# Patient Record
Sex: Female | Born: 1940
Health system: Southern US, Community
[De-identification: ages and names within clinical notes are randomized; demographics above are authoritative.]

## PROBLEM LIST (undated history)

## (undated) DIAGNOSIS — E039 Hypothyroidism, unspecified: Secondary | ICD-10-CM

## (undated) DIAGNOSIS — R569 Unspecified convulsions: Secondary | ICD-10-CM

## (undated) DIAGNOSIS — E785 Hyperlipidemia, unspecified: Secondary | ICD-10-CM

## (undated) DIAGNOSIS — E119 Type 2 diabetes mellitus without complications: Secondary | ICD-10-CM

## (undated) DIAGNOSIS — I10 Essential (primary) hypertension: Secondary | ICD-10-CM

## (undated) HISTORY — PX: BREAST SURGERY: SHX581

## (undated) HISTORY — DX: Essential (primary) hypertension: I10

## (undated) HISTORY — DX: Type 2 diabetes mellitus without complications: E11.9

## (undated) HISTORY — DX: Unspecified convulsions: R56.9

## (undated) HISTORY — DX: Hyperlipidemia, unspecified: E78.5

## (undated) HISTORY — DX: Hypothyroidism, unspecified: E03.9

## (undated) HISTORY — PX: ABDOMINAL HYSTERECTOMY: SHX81

---

## 1986-04-29 HISTORY — PX: THYROIDECTOMY: SHX17

## 2017-08-21 ENCOUNTER — Ambulatory Visit (INDEPENDENT_AMBULATORY_CARE_PROVIDER_SITE_OTHER): Payer: Medicare HMO | Admitting: Family Medicine

## 2017-08-21 ENCOUNTER — Encounter: Payer: Self-pay | Admitting: Family Medicine

## 2017-08-21 VITALS — BP 148/76 | HR 77 | Temp 97.6°F | Ht 66.0 in | Wt 288.0 lb

## 2017-08-21 DIAGNOSIS — Z7689 Persons encountering health services in other specified circumstances: Secondary | ICD-10-CM | POA: Diagnosis not present

## 2017-08-21 DIAGNOSIS — E890 Postprocedural hypothyroidism: Secondary | ICD-10-CM

## 2017-08-21 DIAGNOSIS — I10 Essential (primary) hypertension: Secondary | ICD-10-CM | POA: Diagnosis not present

## 2017-08-21 DIAGNOSIS — R569 Unspecified convulsions: Secondary | ICD-10-CM

## 2017-08-21 DIAGNOSIS — E119 Type 2 diabetes mellitus without complications: Secondary | ICD-10-CM

## 2017-08-21 LAB — GLUCOSE, POCT (MANUAL RESULT ENTRY): POC Glucose: 101 mg/dl — AB (ref 70–99)

## 2017-08-21 LAB — POCT GLYCOSYLATED HEMOGLOBIN (HGB A1C): Hemoglobin A1C: 6

## 2017-08-21 MED ORDER — AMLODIPINE BESYLATE 10 MG PO TABS: 10.0000 mg | ORAL_TABLET | Freq: Every day | ORAL | 2 refills | Status: DC

## 2017-08-21 MED ORDER — METOPROLOL SUCCINATE ER 100 MG PO TB24: 100.0000 mg | ORAL_TABLET | Freq: Every day | ORAL | 2 refills | Status: DC

## 2017-08-21 NOTE — Progress Notes (Signed)
Patient presents to clinic today to establish care.  SUBJECTIVE: PMH: Pt is a 77 yo female with pmh sig for HTN, DM, hypothyroidism, seizures.  [place] was previously seen in [place].   DM type 2: -diagnosed years ago. -taking metformin 500 mg BID -pt does not check fsbs at -Last eye exam 2018, last foot exam January 2018  History of seizures: -Last seizure was the day before Thanksgiving 2018, she was walking into a neighborhood store, felt funny, then woke up in the hospital with a gash on her forehead. -Pt states prior to that her last seizure was 4 years ago. -Pt states she was on medication for seizures but was doing well so her doctor stopped the medication and labs when she had the seizure Thanksgiving. -Pt is currently taking Keppra 500 mg twice daily. -Patient is not driving  Hypothyroidism: -Patient status post thyroidectomy for cancer in 1988 -She is currently taking levothyroxine 150 mcg daily -Pt states she had recent labs prior to leaving [place]. -Patient denies constipation, diarrhea, palpitations, heat intolerance, cold intolerance  HTN: -Patient taking metoprolol 100 mg twice daily, losartan-HCTZ 100 mg - 25 mg daily, Norvasc 10 mg daily, furosemide 40 mg as needed -Patient does not check BP at home -Patient is not currently exercising.  Allergies: NKDA  Past history Thyroidectomy 1988 for thyroid cancer Breast biopsy 2017 Hysterectomy 1975 for fibroids  Social history: Patient is married.  Patient and her husband moved from [place] to [place] to live with her daughter.  Patient states she had to move and she was unable to keep up with housework.  Patient's husband is legally blind so she was still cooking for him daily.  Patient is retired she is a former Engineer, sitemedical assistant.  Patient has 2 children.  Patient denies alcohol, tobacco, drug use.  Health Maintenance: [facility] [place] [facility] [place] Vision --Dr. Ulice BoldSilbert [place]  2018 Last physical --2019 Immunizations --Pneumovax 2018, influenza vaccine 2018.  Patient has not had shingles vaccine. Mammogram --2019 PAP --2017 Bone Density --? LMP--1975   No past medical history on file.   No current outpatient medications on file prior to visit.   No current facility-administered medications on file prior to visit.     Allergies not on file  No family history on file.  Social History   Socioeconomic History  . Marital status: Married    Spouse name: Not on file  . Number of children: Not on file  . Years of education: Not on file  . Highest education level: Not on file  Occupational History  . Not on file  Social Needs  . Financial resource strain: Not on file  . Food insecurity:    Worry: Not on file    Inability: Not on file  . Transportation needs:    Medical: Not on file    Non-medical: Not on file  Tobacco Use  . Smoking status: Not on file  Substance and Sexual Activity  . Alcohol use: Not on file  . Drug use: Not on file  . Sexual activity: Not on file  Lifestyle  . Physical activity:    Days per week: Not on file    Minutes per session: Not on file  . Stress: Not on file  Relationships  . Social connections:    Talks on phone: Not on file    Gets together: Not on file    Attends religious service: Not on file    Active member  of club or organization: Not on file    Attends meetings of clubs or organizations: Not on file    Relationship status: Not on file  . Intimate partner violence:    Fear of current or ex partner: Not on file    Emotionally abused: Not on file    Physically abused: Not on file    Forced sexual activity: Not on file  Other Topics Concern  . Not on file  Social History Narrative  . Not on file    ROS General: Denies fever, chills, night sweats, changes in weight, changes in appetite HEENT: Denies headaches, ear pain, changes in vision, rhinorrhea, sore throat CV: Denies CP, palpitations, SOB,  orthopnea Pulm: Denies SOB, cough, wheezing GI: Denies abdominal pain, nausea, vomiting, diarrhea, constipation GU: Denies dysuria, hematuria, frequency, vaginal discharge Msk: Denies muscle cramps, joint pains Neuro: Denies weakness, numbness, tingling  +h/o seizures Skin: Denies rashes, bruising Psych: Denies depression, anxiety, hallucinations  BP (!) 148/76 (BP Location: Left Arm, Patient Position: Sitting, Cuff Size: Normal)   Pulse 77   Temp 97.6 F (36.4 C) (Oral)   Ht 5\' 6"  (1.676 m)   Wt 288 lb (130.6 kg)   SpO2 97%   BMI 46.48 kg/m   Physical Exam Gen. Pleasant, well developed, well-nourished, in NAD HEENT - [place]/AT, PERRL, no scleral icterus, no nasal drainage, pharynx without erythema or exudate.  TMs normal bilaterally.  The neck. Lungs: no use of accessory muscles, CTAB, no wheezes, rales or rhonchi Cardiovascular: RRR, No r/g/m, no peripheral edema Abdomen: BS present, soft, nontender, nondistended Neuro:  A&Ox3, CN II-XII intact, normal gait Skin:  Warm, dry, intact, no lesions Psych: normal affect, mood appropriate  No results found for this or any previous visit (from the past 2160 hour(s)).  Assessment/Plan: Type 2 diabetes mellitus without complication, without long-term current use of insulin (HCC)  -Continue metformin 500 mg twice daily -Hemoglobin A1c 6% -FSBS 101 -Continue lifestyle modifications - Plan: Ambulatory referral to Podiatry, POCT glucose (manual entry), POCT glycosylated hemoglobin (Hb A1C)  Essential hypertension -Controlled -Patient encouraged to limit sodium intake  - Plan: amLODipine (NORVASC) 10 MG tablet, metoprolol succinate (TOPROL-XL) 100 MG 24 hr tablet  Postoperative hypothyroidism -Continue levothyroxine 150 mcg daily -We will obtain previous records. -We will obtain TSH in the future.  Seizures (HCC) -Continue Keppra 500 mg twice daily - Plan: Ambulatory referral to Neurology  Encounter to establish care -We reviewed  the PMH, PSH, FH, SH, Meds and Allergies. -We provided refills for any medications we will prescribe as needed. -We addressed current concerns per orders and patient instructions. -We have asked for records for pertinent exams, studies, vaccines and notes from previous providers. -We have advised patient to follow up per instructions below.  F/u in the next 2-3 months, sooner if needed.  Abbe Amsterdam, MD

## 2017-08-21 NOTE — Patient Instructions (Addendum)
You should get a blood pressure monitor to measure your blood pressure at home.  They can be found at your local drugstore, [employer], [employer], on-line at places such as [employer].siAmazon.com.  Try to find a blood pressure cuff that goes on your upper as opposed to your wrist.  Check your blood pressure daily and keep a log of this to bring with you to appointments.  If your blood pressure is well controlled, we may be able to decrease some of your medications.   DASH Eating Plan DASH stands for "Dietary Approaches to Stop Hypertension." The DASH eating plan is a healthy eating plan that has been shown to reduce high blood pressure (hypertension). It may also reduce your risk for type 2 diabetes, heart disease, and stroke. The DASH eating plan may also help with weight loss. What are tips for following this plan? General guidelines  Avoid eating more than 2,300 mg (milligrams) of salt (sodium) a day. If you have hypertension, you may need to reduce your sodium intake to 1,500 mg a day.  Limit alcohol intake to no more than 1 drink a day for nonpregnant women and 2 drinks a day for men. One drink equals 12 oz of beer, 5 oz of wine, or 1 oz of hard liquor.  Work with your health care provider to maintain a healthy body weight or to lose weight. Ask what an ideal weight is for you.  Get at least 30 minutes of exercise that causes your heart to beat faster (aerobic exercise) most days of the week. Activities may include walking, swimming, or biking.  Work with your health care provider or diet and nutrition specialist (dietitian) to adjust your eating plan to your individual calorie needs. Reading food labels  Check food labels for the amount of sodium per serving. Choose foods with less than 5 percent of the Daily Value of sodium. Generally, foods with less than 300 mg of sodium per serving fit into this eating plan.  To find whole grains, look for the word "whole" as the first word in the ingredient  list. Shopping  Buy products labeled as "low-sodium" or "no salt added."  Buy fresh foods. Avoid canned foods and premade or frozen meals. Cooking  Avoid adding salt when cooking. Use salt-free seasonings or herbs instead of table salt or sea salt. Check with your health care provider or pharmacist before using salt substitutes.  Do not fry foods. Cook foods using healthy methods such as baking, boiling, grilling, and broiling instead.  Cook with heart-healthy oils, such as olive, canola, soybean, or sunflower oil. Meal planning   Eat a balanced diet that includes: ? 5 or more servings of fruits and vegetables each day. At each meal, try to fill half of your plate with fruits and vegetables. ? Up to 6-8 servings of whole grains each day. ? Less than 6 oz of lean meat, poultry, or fish each day. A 3-oz serving of meat is about the same size as a deck of cards. One egg equals 1 oz. ? 2 servings of low-fat dairy each day. ? A serving of nuts, seeds, or beans 5 times each week. ? Heart-healthy fats. Healthy fats called Omega-3 fatty acids are found in foods such as flaxseeds and coldwater fish, like sardines, salmon, and mackerel.  Limit how much you eat of the following: ? Canned or prepackaged foods. ? Food that is high in trans fat, such as fried foods. ? Food that is high in saturated fat, such  as fatty meat. ? Sweets, desserts, sugary drinks, and other foods with added sugar. ? Full-fat dairy products.  Do not salt foods before eating.  Try to eat at least 2 vegetarian meals each week.  Eat more home-cooked food and less restaurant, buffet, and fast food.  When eating at a restaurant, ask that your food be prepared with less salt or no salt, if possible. What foods are recommended? The items listed may not be a complete list. Talk with your dietitian about what dietary choices are best for you. Grains Whole-grain or whole-wheat bread. Whole-grain or whole-wheat pasta. Brown  rice. [place]. Bulgur. Whole-grain and low-sodium cereals. Pita bread. Low-fat, low-sodium crackers. Whole-wheat flour tortillas. Vegetables Fresh or frozen vegetables (raw, steamed, roasted, or grilled). Low-sodium or reduced-sodium tomato and vegetable juice. Low-sodium or reduced-sodium tomato sauce and tomato paste. Low-sodium or reduced-sodium canned vegetables. Fruits All fresh, dried, or frozen fruit. Canned fruit in natural juice (without added sugar). Meat and other protein foods Skinless chicken or [place]. Ground chicken or [place]. Pork with fat trimmed off. Fish and seafood. Egg whites. Dried beans, peas, or lentils. Unsalted nuts, nut butters, and seeds. Unsalted canned beans. Lean cuts of beef with fat trimmed off. Low-sodium, lean deli meat. Dairy Low-fat (1%) or fat-free (skim) milk. Fat-free, low-fat, or reduced-fat cheeses. Nonfat, low-sodium ricotta or cottage cheese. Low-fat or nonfat yogurt. Low-fat, low-sodium cheese. Fats and oils Soft margarine without trans fats. Vegetable oil. Low-fat, reduced-fat, or light mayonnaise and salad dressings (reduced-sodium). Canola, safflower, olive, soybean, and sunflower oils. Avocado. Seasoning and other foods Herbs. Spices. Seasoning mixes without salt. Unsalted popcorn and pretzels. Fat-free sweets. What foods are not recommended? The items listed may not be a complete list. Talk with your dietitian about what dietary choices are best for you. Grains Baked goods made with fat, such as croissants, muffins, or some breads. Dry pasta or rice meal packs. Vegetables Creamed or fried vegetables. Vegetables in a cheese sauce. Regular canned vegetables (not low-sodium or reduced-sodium). Regular canned tomato sauce and paste (not low-sodium or reduced-sodium). Regular tomato and vegetable juice (not low-sodium or reduced-sodium). Angie Fava. Olives. Fruits Canned fruit in a light or heavy syrup. Fried fruit. Fruit in cream or butter  sauce. Meat and other protein foods Fatty cuts of meat. Ribs. Fried meat. Berniece Salines. Sausage. Bologna and other processed lunch meats. Salami. Fatback. Hotdogs. Bratwurst. Salted nuts and seeds. Canned beans with added salt. Canned or smoked fish. Whole eggs or egg yolks. Chicken or [place] with skin. Dairy Whole or 2% milk, cream, and half-and-half. Whole or full-fat cream cheese. Whole-fat or sweetened yogurt. Full-fat cheese. Nondairy creamers. Whipped toppings. Processed cheese and cheese spreads. Fats and oils Butter. Stick margarine. Lard. Shortening. Ghee. Bacon fat. Tropical oils, such as coconut, palm kernel, or palm oil. Seasoning and other foods Salted popcorn and pretzels. Onion salt, garlic salt, seasoned salt, table salt, and sea salt. Worcestershire sauce. Tartar sauce. Barbecue sauce. Teriyaki sauce. Soy sauce, including reduced-sodium. Steak sauce. Canned and packaged gravies. Fish sauce. Oyster sauce. Cocktail sauce. Horseradish that you find on the shelf. Ketchup. Mustard. Meat flavorings and tenderizers. Bouillon cubes. Hot sauce and Tabasco sauce. Premade or packaged marinades. Premade or packaged taco seasonings. Relishes. Regular salad dressings. Where to find more information:  National Heart, Lung, and [place]: https://wilson-eaton.com/  American Heart Association: www.heart.org Summary  The DASH eating plan is a healthy eating plan that has been shown to reduce high blood pressure (hypertension). It may also reduce your risk  for type 2 diabetes, heart disease, and stroke.  With the DASH eating plan, you should limit salt (sodium) intake to 2,300 mg a day. If you have hypertension, you may need to reduce your sodium intake to 1,500 mg a day.  When on the DASH eating plan, aim to eat more fresh fruits and vegetables, whole grains, lean proteins, low-fat dairy, and heart-healthy fats.  Work with your health care provider or diet and nutrition specialist (dietitian) to adjust  your eating plan to your individual calorie needs. This information is not intended to replace advice given to you by your health care provider. Make sure you discuss any questions you have with your health care provider. Document Released: 04/04/2011 Document Revised: 04/08/2016 Document Reviewed: 04/08/2016 Elsevier Interactive Patient Education  2018 [employer].  Diabetes Mellitus and Skin Care Diabetes (diabetes mellitus) can lead to health problems over time, including skin problems. People with diabetes have a higher risk for many types of skin complications. This is because having poorly controlled blood sugar (glucose) levels can:  Damage nerves and blood vessels. This can result in decreased feeling in your legs and feet, which means you may not notice minor skin injuries that could lead to serious problems.  Reduce blood flow (circulation), which makes wounds heal more slowly and increases your risk of infection.  Cause areas of skin to become thick or discolored.  What are some common skin conditions that affect people with diabetes? Diabetes often causes dry skin. It can also cause the skin on the feet to get thinner, break more easily, and heal more slowly. There are certain skin conditions that commonly affect people who have diabetes, such as:  Bacterial skin infections, such as styes, boils, infected hair follicles, and infections of the skin around the nails.  Fungal skin infections. These are most common in areas where skin rubs together, such as in the armpits or under the breasts.  Open sores, especially on the feet.  Tissue death (gangrene). This can happen on your feet if a serious infection does not heal properly. Gangrene can cause the need for a foot or leg to be surgically removed (amputated).  Diabetes can also cause the skin to change. You may develop:  Dark, velvety markings on the skin that usually appear on the face, neck, armpits, inner thighs, and groin  (acanthosis nigricans). This typically affects people of African-American and American-Indian descent.  Red, raised, scar-like tissue that may itch, feel painful, or develop into a wound (necrobiosis lipoidica).  Blisters on feet, toes, hands, or fingers.  Thickened, wax-like areas of skin that usually occur on the hands, forehead, or toes (digital sclerosis).  Brown or red ring-shaped or half-ring-shaped patches of skin on the ears or fingers (disseminated granuloma).  Pea-shaped yellow bumps that may be itchy and surrounded by a red ring (eruptive xanthomatosis). This usually affects the arms, feet, buttocks, and the top of the hands.  Round, discolored patches of tan skin that do not hurt or itch (diabetic dermopathy). These may look like age spots.  What do I need to know about itchy skin? It is common for people with diabetes to have itchy skin caused by dryness. Frequent high blood glucose levels can cause itchiness, and poor circulation and certain skin infections can make dry, itchy skin worse. If you have itchy skin that is red or covered in a rash, this could be a sign of an allergic reaction to a medicine. If you have a rash or if your  skin is very itchy, contact your health care provider. You may need help to manage your diabetes better, or you may need treatment for an infection. How can I prevent skin breakdown? When you have diabetes and you get a badly infected ulcer or sore that does not heal, your skin can break down, especially if you have poor circulation or are on bed rest. To prevent skin breakdown:  Keep your skin clean and dry. Wash your skin often. Do not use hot water.  Do not use any products that contain nicotine or tobacco, such as cigarettes and e-cigarettes. Smoking affects the body's ability to heal. If you need help quitting, ask your health care provider.  Check your skin every day for cuts, bruises, redness, blisters, or sores, especially on your feet. Tell  your health care provider about any cuts, wounds, or sores you have, especially if they are healing slowly.  If you are on bed rest, try to change positions often.  What else do I need to know about taking care of my skin?   To relieve dry skin and itching: ? Limit baths and showers to 5-10 minutes. ? Bathe with lukewarm water instead of hot water. ? Use mild soap and gentle skin cleansers. Do not use soap that is perfumed or harsh or dries your skin. ? Put on lotion as soon as you finish bathing.  Make sure that your health care provider performs a visual foot exam at every medical visit.  Schedule a foot exam with your health care provider once every year. This exam includes an inspection of the structure and skin of your feet.  If you get a skin injury, such as a cut, blister, or sore, check the area every day for signs of infection. Check for: ? More redness, swelling, or pain. ? More fluid or blood. ? Warmth. ? Pus or a bad smell. Contact a health care provider if:  You develop a cut or sore, especially on your feet.  You develop signs of infection after a skin injury.  Your blood glucose level is higher than 240 mg/dL (16.1 mmol/L) for 2 days in a row.  You have itchy skin that develops redness or a rash.  You have discolored areas of skin.  You have areas where your skin is changing, such as thickening or appearing shiny. This information is not intended to replace advice given to you by your health care provider. Make sure you discuss any questions you have with your health care provider. Document Released: 09/26/2015 Document Revised: 11/03/2015 Document Reviewed: 09/26/2015 Elsevier Interactive Patient Education  2018 [employer].  How to Take Your Blood Pressure You can take your blood pressure at home with a machine. You may need to check your blood pressure at home:  To check if you have high blood pressure (hypertension).  To check your blood pressure over  time.  To make sure your blood pressure medicine is working.  Supplies needed: You will need a blood pressure machine, or monitor. You can buy one at a drugstore or online. When choosing one:  Choose one with an arm cuff.  Choose one that wraps around your upper arm. Only one finger should fit between your arm and the cuff.  Do not choose one that measures your blood pressure from your wrist or finger.  Your doctor can suggest a monitor. How to prepare Avoid these things for 30 minutes before checking your blood pressure:  Drinking caffeine.  Drinking alcohol.  Eating.  Smoking.  Exercising.  Five minutes before checking your blood pressure:  Pee.  Sit in a dining chair. Avoid sitting in a soft couch or armchair.  Be quiet. Do not talk.  How to take your blood pressure Follow the instructions that came with your machine. If you have a digital blood pressure monitor, these may be the instructions: 1. Sit up straight. 2. Place your feet on the floor. Do not cross your ankles or legs. 3. Rest your left arm at the level of your heart. You may rest it on a table, desk, or chair. 4. Pull up your shirt sleeve. 5. Wrap the blood pressure cuff around the upper part of your left arm. The cuff should be 1 inch (2.5 cm) above your elbow. It is best to wrap the cuff around bare skin. 6. Fit the cuff snugly around your arm. You should be able to place only one finger between the cuff and your arm. 7. Put the cord inside the groove of your elbow. 8. Press the power button. 9. Sit quietly while the cuff fills with air and loses air. 10. Write down the numbers on the screen. 11. Wait 2-3 minutes and then repeat steps 1-10.  What do the numbers mean? Two numbers make up your blood pressure. The first number is called systolic pressure. The second is called diastolic pressure. An example of a blood pressure reading is "120 over 80" (or 120/80). If you are an adult and do not have a  medical condition, use this guide to find out if your blood pressure is normal: Normal  First number: below 120.  Second number: below 80. Elevated  First number: 120-129.  Second number: below 80. Hypertension stage 1  First number: 130-139.  Second number: 80-89. Hypertension stage 2  First number: 140 or above.  Second number: 90 or above. Your blood pressure is above normal even if only the top or bottom number is above normal. Follow these instructions at home:  Check your blood pressure as often as your doctor tells you to.  Take your monitor to your next doctor's appointment. Your doctor will: ? Make sure you are using it correctly. ? Make sure it is working right.  Make sure you understand what your blood pressure numbers should be.  Tell your doctor if your medicines are causing side effects. Contact a doctor if:  Your blood pressure keeps being high. Get help right away if:  Your first blood pressure number is higher than 180.  Your second blood pressure number is higher than 120. This information is not intended to replace advice given to you by your health care provider. Make sure you discuss any questions you have with your health care provider. Document Released: 03/28/2008 Document Revised: 03/13/2016 Document Reviewed: 09/22/2015 Elsevier Interactive Patient Education  [employer].

## 2017-08-26 ENCOUNTER — Telehealth: Payer: Self-pay | Admitting: Family Medicine

## 2017-08-26 ENCOUNTER — Other Ambulatory Visit: Payer: Self-pay | Admitting: Family Medicine

## 2017-08-26 MED ORDER — ATORVASTATIN CALCIUM 80 MG PO TABS: 80.0000 mg | ORAL_TABLET | Freq: Every day | ORAL | 1 refills | Status: DC

## 2017-08-26 NOTE — Telephone Encounter (Signed)
Copied from CRM [id number]. Topic: Quick Communication - Rx Refill/Question >> Aug 26, 2017 11:04 AM Maia Petties wrote: Medication: atrovastatin - pt has 4 pills left - she takes 1/day Has the patient contacted their pharmacy? No - first refill from Dr. Salomon Fick. Preferred Pharmacy (with phone number or street name): [id number] [place], [place] - [address] [phone] (Phone) [phone] (Fax)

## 2017-08-26 NOTE — Telephone Encounter (Signed)
Medication was refilled.

## 2017-09-16 ENCOUNTER — Telehealth: Payer: Self-pay | Admitting: Family Medicine

## 2017-09-16 NOTE — Telephone Encounter (Signed)
Copied from CRM [id number]. Topic: Quick Communication - Rx Refill/Question >> Sep 16, 2017 10:09 AM Floria Raveling A wrote: Medication:  losartan-hydrochlorothiazide (HYZAAR) 100-12.5 MG tablet [[id number] aspirin 81 MG tablet [[id number]] levothyroxine (SYNTHROID, LEVOTHROID) 150 MCG tablet [[id number]   Has the patient contacted their pharmacy? No  (Agent: If no, request that the patient contact the pharmacy for the refill.) (Agent: If yes, when and what did the pharmacy advise?)  Preferred Pharmacy (with phone number [facility] street name): [facility] [address]   Agent: Please be advised that RX refills may take up to 3 business days. We ask that you follow-up with your pharmacy.

## 2017-09-16 NOTE — Telephone Encounter (Signed)
Pt requesting refill of Losartan-hydrochlorothiazide (Hyzaar) and Levothyroxine (Synthroid) .Medications previously filled by historical provider.   LOV: 08/21/17 Dr. Salomon Fick  [facility] [address]

## 2017-09-17 NOTE — Telephone Encounter (Signed)
Ok to refill both meds 

## 2017-09-17 NOTE — Telephone Encounter (Signed)
Please advise 

## 2017-09-18 MED ORDER — LOSARTAN POTASSIUM-HCTZ 100-12.5 MG PO TABS: 1.0000 | ORAL_TABLET | Freq: Every day | ORAL | 1 refills | Status: DC

## 2017-09-18 MED ORDER — LEVOTHYROXINE SODIUM 150 MCG PO TABS: 150.0000 ug | ORAL_TABLET | Freq: Every day | ORAL | 1 refills | Status: DC

## 2017-09-18 NOTE — Telephone Encounter (Signed)
Medication filled to pharmacy as requested.   

## 2017-10-24 ENCOUNTER — Ambulatory Visit: Payer: Medicare HMO | Admitting: Family Medicine

## 2017-10-24 ENCOUNTER — Ambulatory Visit: Payer: Medicare HMO | Admitting: Podiatry

## 2017-11-06 ENCOUNTER — Other Ambulatory Visit: Payer: Self-pay | Admitting: Family Medicine

## 2017-11-06 NOTE — Telephone Encounter (Signed)
Copied from CRM [id number]. Topic: Quick Communication - See Telephone Encounter >> Nov 06, 2017 12:03 PM Windy KalataMichael, Ingrid Shifrin L, NT wrote: CRM for notification. See Telephone encounter for: 11/06/17.  Patient is needing a refill on metFORMIN (GLUCOPHAGE) 500 MG tablet and levETIRAcetam (KEPPRA) 500 MG table. Please advise.  [id number] [place] [place] - [address] [place] [id number] Phone: [phone] Fax: [phone]

## 2017-11-07 NOTE — Telephone Encounter (Signed)
Ok to refill both metformin and keppra.

## 2017-11-10 MED ORDER — LEVETIRACETAM 500 MG PO TABS: 500.0000 mg | ORAL_TABLET | Freq: Two times a day (BID) | ORAL | 2 refills | Status: DC

## 2017-11-10 MED ORDER — METFORMIN HCL 500 MG PO TABS: 500.0000 mg | ORAL_TABLET | Freq: Two times a day (BID) | ORAL | 2 refills | Status: DC

## 2017-11-10 NOTE — Telephone Encounter (Signed)
Patient;s daughter called and said only has 1 tablet left for her seizure medication and really needs this today. Please advise. [facility] [address]

## 2017-11-10 NOTE — Telephone Encounter (Signed)
Pt aware that Rxs have been filled to pharmacy. Nothing further needed.

## 2017-11-12 DIAGNOSIS — I10 Essential (primary) hypertension: Secondary | ICD-10-CM | POA: Diagnosis not present

## 2017-11-12 DIAGNOSIS — Z6841 Body Mass Index (BMI) 40.0 and over, adult: Secondary | ICD-10-CM | POA: Diagnosis not present

## 2017-11-12 DIAGNOSIS — E1140 Type 2 diabetes mellitus with diabetic neuropathy, unspecified: Secondary | ICD-10-CM | POA: Diagnosis not present

## 2017-11-12 DIAGNOSIS — G40909 Epilepsy, unspecified, not intractable, without status epilepticus: Secondary | ICD-10-CM | POA: Diagnosis not present

## 2017-11-12 DIAGNOSIS — E782 Mixed hyperlipidemia: Secondary | ICD-10-CM | POA: Diagnosis not present

## 2017-11-12 DIAGNOSIS — R600 Localized edema: Secondary | ICD-10-CM | POA: Diagnosis not present

## 2017-11-12 DIAGNOSIS — R011 Cardiac murmur, unspecified: Secondary | ICD-10-CM | POA: Diagnosis not present

## 2017-11-12 DIAGNOSIS — E890 Postprocedural hypothyroidism: Secondary | ICD-10-CM | POA: Diagnosis not present

## 2018-02-19 ENCOUNTER — Ambulatory Visit: Payer: Medicare HMO | Admitting: Podiatry

## 2018-02-19 ENCOUNTER — Encounter: Payer: Self-pay | Admitting: Podiatry

## 2018-02-19 VITALS — BP 113/66

## 2018-02-19 DIAGNOSIS — L84 Corns and callosities: Secondary | ICD-10-CM | POA: Diagnosis not present

## 2018-02-19 DIAGNOSIS — E1142 Type 2 diabetes mellitus with diabetic polyneuropathy: Secondary | ICD-10-CM

## 2018-02-19 DIAGNOSIS — B353 Tinea pedis: Secondary | ICD-10-CM | POA: Diagnosis not present

## 2018-02-19 DIAGNOSIS — M79675 Pain in left toe(s): Secondary | ICD-10-CM | POA: Diagnosis not present

## 2018-02-19 DIAGNOSIS — B351 Tinea unguium: Secondary | ICD-10-CM

## 2018-02-19 DIAGNOSIS — M79674 Pain in right toe(s): Secondary | ICD-10-CM | POA: Diagnosis not present

## 2018-02-19 MED ORDER — CLOTRIMAZOLE-BETAMETHASONE 1-0.05 % EX CREA: TOPICAL_CREAM | CUTANEOUS | 1 refills | Status: DC

## 2018-02-19 NOTE — Patient Instructions (Signed)
Athlete's Foot Athlete's foot (tinea pedis) is a fungal infection of the skin on the feet. It often occurs on the skin that is between or underneath the toes. It can also occur on the soles of the feet. The infection can spread from person to person (is contagious). What are the causes? Athlete's foot is caused by a fungus. This fungus grows in warm, moist places. Most people get athlete's foot by sharing shower stalls, towels, and wet floors with someone who is infected. Not washing your feet or changing your socks often enough can contribute to athlete's foot. What increases the risk? This condition is more likely to develop in:  Men.  People who have a weak body defense system (immune system).  People who have diabetes.  People who use public showers, such as at a gym.  People who wear heavy-duty shoes, such as industrial or military shoes.  Seasons with warm, humid weather.  What are the signs or symptoms? Symptoms of this condition include:  Itchy areas between the toes or on the soles of the feet.  White, flaky, or scaly areas between the toes or on the soles of the feet.  Very itchy small blisters between the toes or on the soles of the feet.  Small cuts on the skin. These cuts can become infected.  Thick or discolored toenails.  How is this diagnosed? This condition is diagnosed with a medical history and physical exam. Your health care provider may also take a skin or toenail sample to be examined. How is this treated? Treatment for this condition includes antifungal medicines. These may be applied as powders, ointments, or creams. In severe cases, an oral antifungal medicine may be given. Follow these instructions at home:  Apply or take over-the-counter and prescription medicines only as told by your health care provider.  Keep all follow-up visits as told by your health care provider. This is important.  Do not scratch your feet.  Keep your feet dry: ? Wear  cotton or wool socks. Change your socks every day or if they become wet. ? Wear shoes that allow air to circulate, such as sandals or canvas tennis shoes.  Wash and dry your feet: ? Every day or as told by your health care provider. ? After exercising. ? Including the area between your toes.  Do not share towels, nail clippers, or other personal items that touch your feet with others.  If you have diabetes, keep your blood sugar under control. How is this prevented?  Do not share towels.  Wear sandals in wet areas, such as locker rooms and shared showers.  Keep your feet dry: ? Wear cotton or wool socks. Change your socks every day or if they become wet. ? Wear shoes that allow air to circulate, such as sandals or canvas tennis shoes.  Wash and dry your feet after exercising. Pay attention to the area between your toes. Contact a health care provider if:  You have a fever.  You have swelling, soreness, warmth, or redness in your foot.  You are not getting better with treatment.  Your symptoms get worse.  You have new symptoms. This information is not intended to replace advice given to you by your health care provider. Make sure you discuss any questions you have with your health care provider. Document Released: 04/12/2000 Document Revised: 09/21/2015 Document Reviewed: 10/17/2014 Elsevier Interactive Patient Education  2018 Elsevier Inc[employer] Diabetes and Foot Care Diabetes may cause you to have problems because of poor   blood supply (circulation) to your feet and legs. This may cause the skin on your feet to become thinner, break easier, and heal more slowly. Your skin may become dry, and the skin may peel and crack. You may also have nerve damage in your legs and feet causing decreased feeling in them. You may not notice minor injuries to your feet that could lead to infections or more serious problems. Taking care of your feet is one of the most important things you can do for  yourself. Follow these instructions at home:  Wear shoes at all times, even in the house. Do not go barefoot. Bare feet are easily injured.  Check your feet daily for blisters, cuts, and redness. If you cannot see the bottom of your feet, use a mirror or ask someone for help.  Wash your feet with warm water (do not use hot water) and mild soap. Then pat your feet and the areas between your toes until they are completely dry. Do not soak your feet as this can dry your skin.  Apply a moisturizing lotion or petroleum jelly (that does not contain alcohol and is unscented) to the skin on your feet and to dry, brittle toenails. Do not apply lotion between your toes.  Trim your toenails straight across. Do not dig under them or around the cuticle. File the edges of your nails with an emery board or nail file.  Do not cut corns or calluses or try to remove them with medicine.  Wear clean socks or stockings every day. Make sure they are not too tight. Do not wear knee-high stockings since they may decrease blood flow to your legs.  Wear shoes that fit properly and have enough cushioning. To break in new shoes, wear them for just a few hours a day. This prevents you from injuring your feet. Always look in your shoes before you put them on to be sure there are no objects inside.  Do not cross your legs. This may decrease the blood flow to your feet.  If you find a minor scrape, cut, or break in the skin on your feet, keep it and the skin around it clean and dry. These areas may be cleansed with mild soap and water. Do not cleanse the area with peroxide, alcohol, or iodine.  When you remove an adhesive bandage, be sure not to damage the skin around it.  If you have a wound, look at it several times a day to make sure it is healing.  Do not use heating pads or hot water bottles. They may burn your skin. If you have lost feeling in your feet or legs, you may not know it is happening until it is too  late.  Make sure your health care provider performs a complete foot exam at least annually or more often if you have foot problems. Report any cuts, sores, or bruises to your health care provider immediately. Contact a health care provider if:  You have an injury that is not healing.  You have cuts or breaks in the skin.  You have an ingrown nail.  You notice redness on your legs or feet.  You feel burning or tingling in your legs or feet.  You have pain or cramps in your legs and feet.  Your legs or feet are numb.  Your feet always feel cold. Get help right away if:  There is increasing redness, swelling, or pain in or around a wound.  There is a   red line that goes up your leg.  Pus is coming from a wound.  You develop a fever or as directed by your health care provider.  You notice a bad smell coming from an ulcer or wound. This information is not intended to replace advice given to you by your health care provider. Make sure you discuss any questions you have with your health care provider. Document Released: 04/12/2000 Document Revised: 09/21/2015 Document Reviewed: 09/22/2012 Elsevier Interactive Patient Education  2017 Elsevier Inc.  

## 2018-02-26 DIAGNOSIS — Z0000 Encounter for general adult medical examination without abnormal findings: Secondary | ICD-10-CM | POA: Diagnosis not present

## 2018-02-26 DIAGNOSIS — R399 Unspecified symptoms and signs involving the genitourinary system: Secondary | ICD-10-CM | POA: Diagnosis not present

## 2018-02-26 DIAGNOSIS — Z23 Encounter for immunization: Secondary | ICD-10-CM | POA: Diagnosis not present

## 2018-03-13 ENCOUNTER — Encounter: Payer: Self-pay | Admitting: Podiatry

## 2018-03-13 ENCOUNTER — Other Ambulatory Visit: Payer: Self-pay | Admitting: Family Medicine

## 2018-03-13 NOTE — Progress Notes (Signed)
Subjective: Beth Lawson presents today referred by Dr. Abbe AmsterdamShannon Banks.  She presents with diabetes and cc of painful, discolored, thick toenails which interfere with daily activities.  Pain is aggravated when wearing enclosed shoe gear.   Beth Lawson has h/o seizures and her husband is blind. [place] She and her husband moved because of ongoing health issues and to be closer to their daughter.  Medical History   Date Unknown Diabetes mellitus, type 2 (HCC)  Date Unknown HTN (hypertension)  Date Unknown Hyperlipidemia  Date Unknown Hypothyroidism  Date Unknown Seizures [facility](HCC)   Surgical History    1988 Thyroidectomy  Date Unknown Abdominal hysterectomy  Date Unknown Breast surgery   Medications    amLODipine (NORVASC) 10 MG tablet    aspirin 81 MG tablet    atorvastatin (LIPITOR) 80 MG tablet    furosemide (LASIX) 40 MG tablet    levETIRAcetam (KEPPRA) 500 MG tablet    levothyroxine (SYNTHROID, LEVOTHROID) 150 MCG tablet    losartan-hydrochlorothiazide (HYZAAR) 100-12.5 MG tablet    metFORMIN (GLUCOPHAGE) 500 MG tablet    metoprolol succinate (TOPROL-XL) 100 MG 24 hr tablet    Multiple Vitamins-Minerals (CENTRUM SILVER ADULT 50+ PO)    Allergies      No Known Allergies   Tobacco History   Smoking Status  Never Smoker  Smokeless Tobacco Status  Never Used   Family History    None   Review of systems: Constitutional: Denies chills fatigue fever sweats weight change Eyes: Denies diplopia glare light sensitivity Ears nose mouth throat: Denies vertigo denies bloody nose rhinitis denies cold sores and snoring Cardiovascular: Denies chest pain tightness, +HTN Respiratory: Denies difficulty breathing, denies congestion Gastrointestinal: Denies abdominal pain, diarrhea, nausea, vomiting Genitourinary: Denies nocturia, pain on urination, blood in urine Musculoskeletal: Denies cramping Skin: +changes in toenails, denies color change  dryness, itchy skin, mole changes, or rash  Neurological: Denies fainting, +Seizures, denies change in speech.  Positive for headaches periodically Endocrine: Denies dry mouth, denies flushing, denies heat intolerance, denies cold intolerance, denies excessive thirst, denies polyuria, denies nocturia, +hypothyroidism Hematological: Denies easy bleeding, excessive bleeding, easy bruising, denies enlarged lymph nodes Allergy/immunological: Denies hives denies frequent infections  Objective: Vascular Examination: Capillary refill time <3 seconds x 10 digits Dorsalis pedis 2/4 b/l posterior tibial pulses 1/4 b/l No digital hair x 10 digits Skin temperature gradient is WNL  Dermatological Examination: Skin with normal turgor, texture and tone b/l Toenails 1-5 b/l discolored, thick, dystrophic with subungual debris and pain with palpation to nailbeds due to thickness of nails. Diffuse scaling noted peripherally and plantarly b/l feet with mild foot odor.  No interdigital macerations.  No blisters, no weeping. No signs of secondary bacterial infection noted. Hyperkeratotic lesions x 2  Musculoskeletal: Muscle strength 5/5 to all LE muscle groups  Neurological: Sensation diminished with 10 gram monofilament. Vibratory sensation diminished.  Assessment: 1. Painful onychomycosis toenails 1-5 b/l  2. NIDDM with periphperal neuropathy 3. Tinea pedis b/l 4. Corns x 2  Plan: 1. Discussed examination and diagnoses on today. Patient consented to treatment on today. Written literature dispensed on today. 2. Toenails 1-5 b/l were debrided in length and girth without iatrogenic bleeding. 3. For tinea pedis, Lotrisone prescribed for patient to apply to both feet twice daily for 4 weeks. 4. Hyperkeratoses debrided x 2. 5. Patient to continue soft, supportive shoe gear 6. Patient to report any pedal injuries to medical professional immediately. 7. Follow up 3 months.  Patient/POA to call should  there be a concern in the interim.

## 2018-03-16 NOTE — Telephone Encounter (Signed)
Pt needs an office visit for more refills 

## 2018-04-11 ENCOUNTER — Other Ambulatory Visit: Payer: Self-pay | Admitting: Family Medicine

## 2018-04-13 ENCOUNTER — Other Ambulatory Visit: Payer: Self-pay

## 2018-04-16 NOTE — Telephone Encounter (Signed)
Pt needs an appointment for further refills  

## 2018-05-21 ENCOUNTER — Ambulatory Visit: Payer: Medicare HMO | Admitting: Podiatry

## 2018-05-21 DIAGNOSIS — E1142 Type 2 diabetes mellitus with diabetic polyneuropathy: Secondary | ICD-10-CM

## 2018-05-21 DIAGNOSIS — L84 Corns and callosities: Secondary | ICD-10-CM

## 2018-05-21 DIAGNOSIS — B351 Tinea unguium: Secondary | ICD-10-CM

## 2018-05-21 DIAGNOSIS — M79675 Pain in left toe(s): Secondary | ICD-10-CM

## 2018-05-21 DIAGNOSIS — M79674 Pain in right toe(s): Secondary | ICD-10-CM

## 2018-05-21 NOTE — Patient Instructions (Signed)
Onychomycosis/Fungal Toenails  WHAT IS IT? An infection that lies within the keratin of your nail plate that is caused by a fungus.  WHY ME? Fungal infections affect all ages, sexes, races, and creeds.  There may be many factors that predispose you to a fungal infection such as age, coexisting medical conditions such as diabetes, or an autoimmune disease; stress, medications, fatigue, genetics, etc.  Bottom line: fungus thrives in a warm, moist environment and your shoes offer such a location.  IS IT CONTAGIOUS? Theoretically, yes.  You do not want to share shoes, nail clippers or files with someone who has fungal toenails.  Walking around barefoot in the same room or sleeping in the same bed is unlikely to transfer the organism.  It is important to realize, however, that fungus can spread easily from one nail to the next on the same foot.  HOW DO WE TREAT THIS?  There are several ways to treat this condition.  Treatment may depend on many factors such as age, medications, pregnancy, liver and kidney conditions, etc.  It is best to ask your doctor which options are available to you.  1. No treatment.   Unlike many other medical concerns, you can live with this condition.  However for many people this can be a painful condition and may lead to ingrown toenails or a bacterial infection.  It is recommended that you keep the nails cut short to help reduce the amount of fungal nail. 2. Topical treatment.  These range from herbal remedies to prescription strength nail lacquers.  About 40-50% effective, topicals require twice daily application for approximately 9 to 12 months or until an entirely new nail has grown out.  The most effective topicals are medical grade medications available through physicians offices. 3. Oral antifungal medications.  With an 80-90% cure rate, the most common oral medication requires 3 to 4 months of therapy and stays in your system for a year as the new nail grows out.  Oral  antifungal medications do require blood work to make sure it is a safe drug for you.  A liver function panel will be performed prior to starting the medication and after the first month of treatment.  It is important to have the blood work performed to avoid any harmful side effects.  In general, this medication safe but blood work is required. 4. Laser Therapy.  This treatment is performed by applying a specialized laser to the affected nail plate.  This therapy is noninvasive, fast, and non-painful.  It is not covered by insurance and is therefore, out of pocket.  The results have been very good with a 80-95% cure rate.  [facility] is the only practice in the area to offer this therapy. 5. Permanent Nail Avulsion.  Removing the entire nail so that a new nail will not grow back.  Corns and Calluses Corns are small areas of thickened skin that occur on the top, sides, or tip of a toe. They contain a cone-shaped core with a point that can press on a nerve below. This causes pain.  Calluses are areas of thickened skin that can occur anywhere on the body, including the hands, fingers, palms, soles of the feet, and heels. Calluses are usually larger than corns. What are the causes? Corns and calluses are caused by rubbing (friction) or pressure, such as from shoes that are too tight or do not fit properly. What increases the risk? Corns are more likely to develop in people   who have misshapen toes (toe deformities), such as hammer toes. Calluses can occur with friction to any area of the skin. They are more likely to develop in people who:  Work with their hands.  Wear shoes that fit poorly, are too tight, or are high-heeled.  Have toe deformities. What are the signs or symptoms? Symptoms of a corn or callus include:  A hard growth on the skin.  Pain or tenderness under the skin.  Redness and swelling.  Increased discomfort while wearing tight-fitting shoes, if your feet are  affected. If a corn or callus becomes infected, symptoms may include:  Redness and swelling that gets worse.  Pain.  Fluid, blood, or pus draining from the corn or callus. How is this diagnosed? Corns and calluses may be diagnosed based on your symptoms, your medical history, and a physical exam. How is this treated? Treatment for corns and calluses may include:  Removing the cause of the friction or pressure. This may involve: ? Changing your shoes. ? Wearing shoe inserts (orthotics) or other protective layers in your shoes, such as a corn pad. ? Wearing gloves.  Applying medicine to the skin (topical medicine) to help soften skin in the hardened, thickened areas.  Removing layers of dead skin with a file to reduce the size of the corn or callus.  Removing the corn or callus with a scalpel or laser.  Taking antibiotic medicines, if your corn or callus is infected.  Having surgery, if a toe deformity is the cause. Follow these instructions at home:   Take over-the-counter and prescription medicines only as told by your health care provider.  If you were prescribed an antibiotic, take it as told by your health care provider. Do not stop taking it even if your condition starts to improve.  Wear shoes that fit well. Avoid wearing high-heeled shoes and shoes that are too tight or too loose.  Wear any padding, protective layers, gloves, or orthotics as told by your health care provider.  Soak your hands or feet and then use a file or pumice stone to soften your corn or callus. Do this as told by your health care provider.  Check your corn or callus every day for symptoms of infection. Contact a health care provider if you:  Notice that your symptoms do not improve with treatment.  Have redness or swelling that gets worse.  Notice that your corn or callus becomes painful.  Have fluid, blood, or pus coming from your corn or callus.  Have new symptoms. Summary  Corns are  small areas of thickened skin that occur on the top, sides, or tip of a toe.  Calluses are areas of thickened skin that can occur anywhere on the body, including the hands, fingers, palms, and soles of the feet. Calluses are usually larger than corns.  Corns and calluses are caused by rubbing (friction) or pressure, such as from shoes that are too tight or do not fit properly.  Treatment may include wearing any padding, protective layers, gloves, or orthotics as told by your health care provider. This information is not intended to replace advice given to you by your health care provider. Make sure you discuss any questions you have with your health care provider. Document Released: 01/20/2004 Document Revised: 02/26/2017 Document Reviewed: 02/26/2017 Elsevier Interactive Patient Education  2019 Elsevier Inc.  

## 2018-06-04 ENCOUNTER — Encounter: Payer: Self-pay | Admitting: Podiatry

## 2018-06-04 NOTE — Progress Notes (Signed)
Subjective: Beth Lawson presents with diabetes, diabetic neuropathy and cc of painful, discolored, thick toenails and painful callus/corn which interfere with activities of daily living. Pain is aggravated when wearing enclosed shoe gear. Pain is relieved with periodic professional debridement.  Deeann Saint, MD is her PCP.   Current Outpatient Medications:  .  amLODipine (NORVASC) 10 MG tablet, Take 1 tablet (10 mg total) by mouth daily., Disp: 90 tablet, Rfl: 2 .  aspirin 81 MG tablet, Take 81 mg by mouth daily., Disp: , Rfl:  .  atorvastatin (LIPITOR) 80 MG tablet, Take 1 tablet (80 mg total) by mouth daily., Disp: 90 tablet, Rfl: 1 .  clotrimazole-betamethasone (LOTRISONE) cream, Apply to scaly skin on both feet and ankles twice daily for 4 weeks, Disp: 30 g, Rfl: 1 .  furosemide (LASIX) 40 MG tablet, Take 40 mg by mouth daily as needed., Disp: , Rfl:  .  levETIRAcetam (KEPPRA) 500 MG tablet, Take 1 tablet (500 mg total) by mouth 2 (two) times daily., Disp: 180 tablet, Rfl: 2 .  levothyroxine (SYNTHROID, LEVOTHROID) 150 MCG tablet, TAKE 1 TABLET (150 MCG TOTAL) BY MOUTH DAILY BEFORE BREAKFAST., Disp: 30 tablet, Rfl: 0 .  losartan-hydrochlorothiazide (HYZAAR) 100-12.5 MG tablet, TAKE 1 TABLET BY MOUTH EVERY DAY, Disp: 90 tablet, Rfl: 0 .  metFORMIN (GLUCOPHAGE) 500 MG tablet, Take 1 tablet (500 mg total) by mouth 2 (two) times daily with a meal., Disp: 180 tablet, Rfl: 2 .  metoprolol succinate (TOPROL-XL) 100 MG 24 hr tablet, Take 1 tablet (100 mg total) by mouth daily. Take with or immediately following a meal., Disp: 90 tablet, Rfl: 2 .  Multiple Vitamins-Minerals (CENTRUM SILVER ADULT 50+ PO), Take 1 tablet by mouth daily., Disp: , Rfl:  .  nitrofurantoin, macrocrystal-monohydrate, (MACROBID) 100 MG capsule, , Disp: , Rfl:   No Known Allergies  Vascular Examination: Capillary refill time <3 seconds x 10 digits Dorsalis pedis 2/4 bilaterally and  Posterior tibial pulses 1/4  bilaterally  No digital hair x 10 digits Skin temperature gradient within normal limits bilaterally  Dermatological Examination: Skin with normal turgor, texture and tone b/l  Toenails 1-5 b/l discolored, thick, dystrophic with subungual debris and pain with palpation to nailbeds due to thickness of nails.  Hyperkeratotic lesion dorsal lesser digits x2  Musculoskeletal: Muscle strength 5/5 to all LE muscle groups  Hammertoes 2 through 5 bilaterally  Neurological: Sensation diminished with 10 gram monofilament. Vibratory sensation diminished  Assessment: 1. Painful onychomycosis toenails 1-5 b/l 2. Corns lesser digits x2 3. NIDDM with Diabetic neuropathy  Plan: 1. Continue diabetic foot care principles.  2. Toenails 1-5 b/l were debrided in length and girth without iatrogenic bleeding. 3. Hyperkeratotic lesion pared with sterile chisel blade lesser digits x2 4. Patient to continue soft, supportive shoe gear 5. Patient to report any pedal injuries to medical professional  6. Follow up 3 months. Patient/POA to call should there be a concern in the interim.

## 2018-08-20 ENCOUNTER — Ambulatory Visit: Payer: Medicare HMO | Admitting: Podiatry

## 2018-08-20 ENCOUNTER — Other Ambulatory Visit: Payer: Self-pay

## 2018-08-20 ENCOUNTER — Encounter: Payer: Self-pay | Admitting: Podiatry

## 2018-08-20 VITALS — Temp 96.6°F

## 2018-08-20 DIAGNOSIS — E1142 Type 2 diabetes mellitus with diabetic polyneuropathy: Secondary | ICD-10-CM | POA: Diagnosis not present

## 2018-08-20 DIAGNOSIS — B351 Tinea unguium: Secondary | ICD-10-CM | POA: Diagnosis not present

## 2018-08-20 DIAGNOSIS — M79675 Pain in left toe(s): Secondary | ICD-10-CM | POA: Diagnosis not present

## 2018-08-20 DIAGNOSIS — M79674 Pain in right toe(s): Secondary | ICD-10-CM | POA: Diagnosis not present

## 2018-08-20 NOTE — Patient Instructions (Signed)
Diabetes Mellitus and Foot Care Foot care is an important part of your health, especially when you have diabetes. Diabetes may cause you to have problems because of poor blood flow (circulation) to your feet and legs, which can cause your skin to:  Become thinner and drier.  Break more easily.  Heal more slowly.  Peel and crack. You may also have nerve damage (neuropathy) in your legs and feet, causing decreased feeling in them. This means that you may not notice minor injuries to your feet that could lead to more serious problems. Noticing and addressing any potential problems early is the best way to prevent future foot problems. How to care for your feet Foot hygiene  Wash your feet daily with warm water and mild soap. Do not use hot water. Then, pat your feet and the areas between your toes until they are completely dry. Do not soak your feet as this can dry your skin.  Trim your toenails straight across. Do not dig under them or around the cuticle. File the edges of your nails with an emery board or nail file.  Apply a moisturizing lotion or petroleum jelly to the skin on your feet and to dry, brittle toenails. Use lotion that does not contain alcohol and is unscented. Do not apply lotion between your toes. Shoes and socks  Wear clean socks or stockings every day. Make sure they are not too tight. Do not wear knee-high stockings since they may decrease blood flow to your legs.  Wear shoes that fit properly and have enough cushioning. Always look in your shoes before you put them on to be sure there are no objects inside.  To break in new shoes, wear them for just a few hours a day. This prevents injuries on your feet. Wounds, scrapes, corns, and calluses  Check your feet daily for blisters, cuts, bruises, sores, and redness. If you cannot see the bottom of your feet, use a mirror or ask someone for help.  Do not cut corns or calluses or try to remove them with medicine.  If you  find a minor scrape, cut, or break in the skin on your feet, keep it and the skin around it clean and dry. You may clean these areas with mild soap and water. Do not clean the area with peroxide, alcohol, or iodine.  If you have a wound, scrape, corn, or callus on your foot, look at it several times a day to make sure it is healing and not infected. Check for: ? Redness, swelling, or pain. ? Fluid or blood. ? Warmth. ? Pus or a bad smell. General instructions  Do not cross your legs. This may decrease blood flow to your feet.  Do not use heating pads or hot water bottles on your feet. They may burn your skin. If you have lost feeling in your feet or legs, you may not know this is happening until it is too late.  Protect your feet from hot and cold by wearing shoes, such as at the beach or on hot pavement.  Schedule a complete foot exam at least once a year (annually) or more often if you have foot problems. If you have foot problems, report any cuts, sores, or bruises to your health care provider immediately. Contact a health care provider if:  You have a medical condition that increases your risk of infection and you have any cuts, sores, or bruises on your feet.  You have an injury that is not   healing.  You have redness on your legs or feet.  You feel burning or tingling in your legs or feet.  You have pain or cramps in your legs and feet.  Your legs or feet are numb.  Your feet always feel cold.  You have pain around a toenail. Get help right away if:  You have a wound, scrape, corn, or callus on your foot and: ? You have pain, swelling, or redness that gets worse. ? You have fluid or blood coming from the wound, scrape, corn, or callus. ? Your wound, scrape, corn, or callus feels warm to the touch. ? You have pus or a bad smell coming from the wound, scrape, corn, or callus. ? You have a fever. ? You have a red line going up your leg. Summary  Check your feet every day  for cuts, sores, red spots, swelling, and blisters.  Moisturize feet and legs daily.  Wear shoes that fit properly and have enough cushioning.  If you have foot problems, report any cuts, sores, or bruises to your health care provider immediately.  Schedule a complete foot exam at least once a year (annually) or more often if you have foot problems. This information is not intended to replace advice given to you by your health care provider. Make sure you discuss any questions you have with your health care provider. Document Released: 04/12/2000 Document Revised: 05/28/2017 Document Reviewed: 05/17/2016 Else[employer] Interactive Patient Education  2019 [employer]  Diabetic Neuropathy Diabetic neuropathy refers to nerve damage that is caused by diabetes (diabetes mellitus). Over time, people with diabetes can develop nerve damage throughout the body. There are several types of diabetic neuropathy:  Peripheral neuropathy. This is the most common type of diabetic neuropathy. It causes damage to nerves that carry signals between the spinal cord and other parts of the body (peripheral nerves). This usually affects nerves in the feet and legs first, and may eventually affect the hands and arms. The damage affects the ability to sense touch or temperature.  Autonomic neuropathy. This type causes damage to nerves that control involuntary functions (autonomic nerves). These nerves carry signals that control: ? Heartbeat. ? Body temperature. ? Blood pressure. ? Urination. ? Digestion. ? Sweating. ? Sexual function. ? Response to changing blood sugar (glucose) levels.  Focal neuropathy. This type of nerve damage affects one area of the body, such as an arm, a leg, or the face. The injury may involve one nerve or a small group of nerves. Focal neuropathy can be painful and unpredictable, and occurs most often in older adults with diabetes. This often develops suddenly, but usually improves over time  and does not cause long-term problems.  Proximal neuropathy. This type of nerve damage affects the nerves of the thighs, hips, buttocks, or legs. It causes severe pain, weakness, and muscle death (atrophy), usually in the thigh muscles. It is more common among older men and people who have type 2 diabetes. The length of recovery time may vary. What are the causes? Peripheral, autonomic, and focal neuropathies are caused by diabetes that is not well controlled with treatment. The cause of proximal neuropathy is not known, but it may be caused by inflammation related to uncontrolled blood glucose levels. What are the signs or symptoms? Peripheral neuropathy Peripheral neuropathy develops slowly over time. When the nerves of the feet and legs no longer work, you may experience:  Burning, stabbing, or aching pain in the legs or feet.  Pain or cramping in the  legs or feet.  Loss of feeling (numbness) and inability to feel pressure or pain in the feet. This can lead to: ? Thick calluses or sores on areas of constant pressure. ? Ulcers. ? Reduced ability to feel temperature changes.  Foot deformities.  Muscle weakness.  Loss of balance or coordination. Autonomic neuropathy The symptoms of autonomic neuropathy vary depending on which nerves are affected. Symptoms may include:  Problems with digestion, such as: ? Nausea or vomiting. ? Poor appetite. ? Bloating. ? Diarrhea or constipation. ? Trouble swallowing. ? Losing weight without trying to.  Problems with the heart, blood and lungs, such as: ? Dizziness, especially when standing up. ? Fainting. ? Shortness of breath. ? Irregular heartbeat.  Bladder problems, such as: ? Trouble starting or stopping urination. ? Leaking urine. ? Trouble emptying the bladder. ? Urinary tract infections (UTIs).  Problems with other body functions, such as: ? Sweat. You may sweat too much or too little. ? Temperature. You might get hot easily.  Or, you might feel cold more than usual. ? Sexual function. Men may not be able to get or maintain an erection. Women may have vaginal dryness and difficulty with arousal. Focal neuropathy Symptoms affect only one area of the body. Common symptoms include:  Numbness.  Tingling.  Burning pain.  Prickling feeling.  Very sensitive skin.  Weakness.  Inability to move (paralysis).  Muscle twitching.  Muscles getting smaller (wasting).  Poor coordination.  Double or blurred vision. Proximal neuropathy  Sudden, severe pain in the hip, thigh, or buttocks. Pain may spread from the back into the legs (sciatica).  Pain and numbness in the arms and legs.  Tingling.  Loss of bladder control or bowel control.  Weakness and wasting of thigh muscles.  Difficulty getting up from a seated position.  Abdominal swelling.  Unexplained weight loss. How is this diagnosed? Diagnosis usually involves reviewing your medical history and any symptoms you have. Diagnosis varies depending on the type of neuropathy your health care provider suspects. Peripheral neuropathy Your health care provider will check areas that are affected by your nervous system (neurologic exam), such as your reflexes, how you move, and what you can feel. You may have other tests, such as:  Blood tests.  Removal and examination of fluid that surrounds the spinal cord (lumbar puncture).  CT scan.  MRI.  A test to check the nerves that control muscles (electromyogram, EMG).  Tests of how quickly messages pass through your nerves (nerve conduction velocity tests).  Removal of a small piece of nerve to be examined under a microscope (biopsy). Autonomic neuropathy You may have tests, such as:  Tests to measure your blood pressure and heart rate. This may include monitoring you while you are safely secured to an exam table that moves you from a lying position to an upright position (table tilt test).  Breathing  tests to check your lungs.  Tests to check how food moves through the digestive system (gastric emptying tests).  Blood, sweat, or urine tests.  Ultrasound of your bladder.  Spinal fluid tests. Focal neuropathy This condition may be diagnosed with:  A neurologic exam.  CT scan.  MRI.  EMG.  Nerve conduction velocity tests. Proximal neuropathy There is no test to diagnose this type of neuropathy. You may have tests to rule out other possible causes of this type of neuropathy. Tests may include:  X-rays of your spine and lumbar region.  Lumbar puncture.  MRI. How is this treated? The  goal of treatment is to keep nerve damage from getting worse. The most important part of treatment is keeping your blood glucose level and your A1C level within your target range by following your diabetes management plan. Over time, maintaining lower blood glucose levels helps lessen symptoms. In some cases, you may need prescription pain medicine. Follow these instructions at home:  Lifestyle   Do not use any products that contain nicotine or tobacco, such as cigarettes and e-cigarettes. If you need help quitting, ask your health care provider.  Be physically active every day. Include strength training and balance exercises.  Follow a healthy meal plan.  Work with your health care provider to manage your blood pressure. General instructions  Follow your diabetes management plan as directed. ? Check your blood glucose levels as directed by your health care provider. ? Keep your blood glucose in your target range as directed by your health care provider. ? Have your A1C level checked at least two times a year, or as often as told by your health care provider.  Take over the counter and prescription medicines only as told by your health care provider. This includes insulin and diabetes medicine.  Do not drive or use heavy machinery while taking prescription pain medicines.  Check your  skin and feet every day for cuts, bruises, redness, blisters, or sores.  Keep all follow up visits as told by your health care provider. This is important. Contact a health care provider if:  You have burning, stabbing, or aching pain in your legs or feet.  You are unable to feel pressure or pain in your feet.  You develop problems with digestion, such as: ? Nausea. ? Vomiting. ? Bloating. ? Constipation. ? Diarrhea. ? Abdominal pain.  You have difficulty with urination, such as inability: ? To control when you urinate (incontinence). ? To completely empty the bladder (retention).  You have palpitations.  You feel dizzy, weak, or faint when you stand up. Get help right away if:  You cannot urinate.  You have sudden weakness or loss of coordination.  You have trouble speaking.  You have pain or pressure in your chest.  You have an irregular heart beat.  You have sudden inability to move a part of your body. Summary  Diabetic neuropathy refers to nerve damage that is caused by diabetes. It can affect nerves throughout the entire body, causing numbness and pain in the arms, legs, digestive tract, heart, and other body systems.  Keep your blood glucose level and your blood pressure in your target range, as directed by your health care provider. This can help prevent neuropathy from getting worse.  Check your skin and feet every day for cuts, bruises, redness, blisters, or sores.  Do not use any products that contain nicotine or tobacco, such as cigarettes and e-cigarettes. If you need help quitting, ask your health care provider. This information is not intended to replace advice given to you by your health care provider. Make sure you discuss any questions you have with your health care provider. Document Released: 06/24/2001 Document Revised: 05/28/2017 Document Reviewed: 05/20/2016 [employer] Interactive Patient Education  2019 [employer]  Diabetic Neuropathy  Diabetic neuropathy refers to nerve damage that is caused by diabetes (diabetes mellitus). Over time, people with diabetes can develop nerve damage throughout the body. There are several types of diabetic neuropathy:  Peripheral neuropathy. This is the most common type of diabetic neuropathy. It causes damage to nerves that carry signals between the spinal  cord and other parts of the body (peripheral nerves). This usually affects nerves in the feet and legs first, and may eventually affect the hands and arms. The damage affects the ability to sense touch or temperature.  Autonomic neuropathy. This type causes damage to nerves that control involuntary functions (autonomic nerves). These nerves carry signals that control: ? Heartbeat. ? Body temperature. ? Blood pressure. ? Urination. ? Digestion. ? Sweating. ? Sexual function. ? Response to changing blood sugar (glucose) levels.  Focal neuropathy. This type of nerve damage affects one area of the body, such as an arm, a leg, or the face. The injury may involve one nerve or a small group of nerves. Focal neuropathy can be painful and unpredictable, and occurs most often in older adults with diabetes. This often develops suddenly, but usually improves over time and does not cause long-term problems.  Proximal neuropathy. This type of nerve damage affects the nerves of the thighs, hips, buttocks, or legs. It causes severe pain, weakness, and muscle death (atrophy), usually in the thigh muscles. It is more common among older men and people who have type 2 diabetes. The length of recovery time may vary. What are the causes? Peripheral, autonomic, and focal neuropathies are caused by diabetes that is not well controlled with treatment. The cause of proximal neuropathy is not known, but it may be caused by inflammation related to uncontrolled blood glucose levels. What are the signs or symptoms? Peripheral neuropathy Peripheral neuropathy develops  slowly over time. When the nerves of the feet and legs no longer work, you may experience:  Burning, stabbing, or aching pain in the legs or feet.  Pain or cramping in the legs or feet.  Loss of feeling (numbness) and inability to feel pressure or pain in the feet. This can lead to: ? Thick calluses or sores on areas of constant pressure. ? Ulcers. ? Reduced ability to feel temperature changes.  Foot deformities.  Muscle weakness.  Loss of balance or coordination. Autonomic neuropathy The symptoms of autonomic neuropathy vary depending on which nerves are affected. Symptoms may include:  Problems with digestion, such as: ? Nausea or vomiting. ? Poor appetite. ? Bloating. ? Diarrhea or constipation. ? Trouble swallowing. ? Losing weight without trying to.  Problems with the heart, blood and lungs, such as: ? Dizziness, especially when standing up. ? Fainting. ? Shortness of breath. ? Irregular heartbeat.  Bladder problems, such as: ? Trouble starting or stopping urination. ? Leaking urine. ? Trouble emptying the bladder. ? Urinary tract infections (UTIs).  Problems with other body functions, such as: ? Sweat. You may sweat too much or too little. ? Temperature. You might get hot easily. Or, you might feel cold more than usual. ? Sexual function. Men may not be able to get or maintain an erection. Women may have vaginal dryness and difficulty with arousal. Focal neuropathy Symptoms affect only one area of the body. Common symptoms include:  Numbness.  Tingling.  Burning pain.  Prickling feeling.  Very sensitive skin.  Weakness.  Inability to move (paralysis).  Muscle twitching.  Muscles getting smaller (wasting).  Poor coordination.  Double or blurred vision. Proximal neuropathy  Sudden, severe pain in the hip, thigh, or buttocks. Pain may spread from the back into the legs (sciatica).  Pain and numbness in the arms and legs.  Tingling.  Loss  of bladder control or bowel control.  Weakness and wasting of thigh muscles.  Difficulty getting up from a seated position.  Abdominal swelling.  Unexplained weight loss. How is this diagnosed? Diagnosis usually involves reviewing your medical history and any symptoms you have. Diagnosis varies depending on the type of neuropathy your health care provider suspects. Peripheral neuropathy Your health care provider will check areas that are affected by your nervous system (neurologic exam), such as your reflexes, how you move, and what you can feel. You may have other tests, such as:  Blood tests.  Removal and examination of fluid that surrounds the spinal cord (lumbar puncture).  CT scan.  MRI.  A test to check the nerves that control muscles (electromyogram, EMG).  Tests of how quickly messages pass through your nerves (nerve conduction velocity tests).  Removal of a small piece of nerve to be examined under a microscope (biopsy). Autonomic neuropathy You may have tests, such as:  Tests to measure your blood pressure and heart rate. This may include monitoring you while you are safely secured to an exam table that moves you from a lying position to an upright position (table tilt test).  Breathing tests to check your lungs.  Tests to check how food moves through the digestive system (gastric emptying tests).  Blood, sweat, or urine tests.  Ultrasound of your bladder.  Spinal fluid tests. Focal neuropathy This condition may be diagnosed with:  A neurologic exam.  CT scan.  MRI.  EMG.  Nerve conduction velocity tests. Proximal neuropathy There is no test to diagnose this type of neuropathy. You may have tests to rule out other possible causes of this type of neuropathy. Tests may include:  X-rays of your spine and lumbar region.  Lumbar puncture.  MRI. How is this treated? The goal of treatment is to keep nerve damage from getting worse. The most important  part of treatment is keeping your blood glucose level and your A1C level within your target range by following your diabetes management plan. Over time, maintaining lower blood glucose levels helps lessen symptoms. In some cases, you may need prescription pain medicine. Follow these instructions at home:  Lifestyle   Do not use any products that contain nicotine or tobacco, such as cigarettes and e-cigarettes. If you need help quitting, ask your health care provider.  Be physically active every day. Include strength training and balance exercises.  Follow a healthy meal plan.  Work with your health care provider to manage your blood pressure. General instructions  Follow your diabetes management plan as directed. ? Check your blood glucose levels as directed by your health care provider. ? Keep your blood glucose in your target range as directed by your health care provider. ? Have your A1C level checked at least two times a year, or as often as told by your health care provider.  Take over the counter and prescription medicines only as told by your health care provider. This includes insulin and diabetes medicine.  Do not drive or use heavy machinery while taking prescription pain medicines.  Check your skin and feet every day for cuts, bruises, redness, blisters, or sores.  Keep all follow up visits as told by your health care provider. This is important. Contact a health care provider if:  You have burning, stabbing, or aching pain in your legs or feet.  You are unable to feel pressure or pain in your feet.  You develop problems with digestion, such as: ? Nausea. ? Vomiting. ? Bloating. ? Constipation. ? Diarrhea. ? Abdominal pain.  You have difficulty with urination, such as inability: ? To control  when you urinate (incontinence). ? To completely empty the bladder (retention).  You have palpitations.  You feel dizzy, weak, or faint when you stand up. Get help right  away if:  You cannot urinate.  You have sudden weakness or loss of coordination.  You have trouble speaking.  You have pain or pressure in your chest.  You have an irregular heart beat.  You have sudden inability to move a part of your body. Summary  Diabetic neuropathy refers to nerve damage that is caused by diabetes. It can affect nerves throughout the entire body, causing numbness and pain in the arms, legs, digestive tract, heart, and other body systems.  Keep your blood glucose level and your blood pressure in your target range, as directed by your health care provider. This can help prevent neuropathy from getting worse.  Check your skin and feet every day for cuts, bruises, redness, blisters, or sores.  Do not use any products that contain nicotine or tobacco, such as cigarettes and e-cigarettes. If you need help quitting, ask your health care provider. This information is not intended to replace advice given to you by your health care provider. Make sure you discuss any questions you have with your health care provider. Document Released: 06/24/2001 Document Revised: 05/28/2017 Document Reviewed: 05/20/2016 [employer] Interactive Patient Education  2019 [employer].

## 2018-08-21 NOTE — Progress Notes (Signed)
Subjective: Beth Lawson presents for preventative foot care with h/o diabetic neuropathy.  She has h/o corns b/l 2nd digits and painful mycotic toenails b/l feet.  Pain is aggravated when wearing enclosed shoe gear. Pain is relieved with periodic professional debridement.  Deeann Saint, MD is her PCP.    Current Outpatient Medications:  .  amLODipine (NORVASC) 10 MG tablet, Take 1 tablet (10 mg total) by mouth daily., Disp: 90 tablet, Rfl: 2 .  aspirin 81 MG tablet, Take 81 mg by mouth daily., Disp: , Rfl:  .  atorvastatin (LIPITOR) 80 MG tablet, Take 1 tablet (80 mg total) by mouth daily., Disp: 90 tablet, Rfl: 1 .  clotrimazole-betamethasone (LOTRISONE) cream, Apply to scaly skin on both feet and ankles twice daily for 4 weeks, Disp: 30 g, Rfl: 1 .  furosemide (LASIX) 40 MG tablet, Take 40 mg by mouth daily as needed., Disp: , Rfl:  .  levETIRAcetam (KEPPRA) 500 MG tablet, Take 1 tablet (500 mg total) by mouth 2 (two) times daily., Disp: 180 tablet, Rfl: 2 .  levothyroxine (SYNTHROID, LEVOTHROID) 150 MCG tablet, TAKE 1 TABLET (150 MCG TOTAL) BY MOUTH DAILY BEFORE BREAKFAST., Disp: 30 tablet, Rfl: 0 .  losartan-hydrochlorothiazide (HYZAAR) 100-12.5 MG tablet, TAKE 1 TABLET BY MOUTH EVERY DAY, Disp: 90 tablet, Rfl: 0 .  metFORMIN (GLUCOPHAGE) 500 MG tablet, Take 1 tablet (500 mg total) by mouth 2 (two) times daily with a meal., Disp: 180 tablet, Rfl: 2 .  metoprolol succinate (TOPROL-XL) 100 MG 24 hr tablet, Take 1 tablet (100 mg total) by mouth daily. Take with or immediately following a meal., Disp: 90 tablet, Rfl: 2 .  Multiple Vitamins-Minerals (CENTRUM SILVER ADULT 50+ PO), Take 1 tablet by mouth daily., Disp: , Rfl:  .  nitrofurantoin, macrocrystal-monohydrate, (MACROBID) 100 MG capsule, , Disp: , Rfl:   No Known Allergies  Vascular Examination: Capillary refill time <3 seconds x 10 digits.  Dorsalis pedis pulses 2/4 b/l.  Posterior tibial pulses 1/4 b/l.  Dgital hair absent  x 10 digits.  Skin temperature gradient WNL b/l.  Dermatological Examination: Skin with normal turgor, texture and tone b/l.  Toenails 1-5 b/l discolored, thick, dystrophic with subungual debris and pain with palpation to nailbeds due to thickness of nails.  Hyperkeratosis resolved dorsal PIPJ b/l 2nd digits.  Musculoskeletal: Muscle strength 5/5 to all LE muscle groups  Hammertoes 2-5 b/l.  Wearing her extra depth diabetic shoes on today's visit.  Neurological: Sensation diminished 2/5 b/l  with 10 gram monofilament.  Vibratory sensation diminished b/l.  Assessment: 1. Painful onychomycosis toenails 1-5 b/l 2. NIDDM with Diabetic neuropathy  Plan: 1. Continue diabetic foot care principles. Literature dispensed on today. 2. Toenails 1-5 b/l were debrided in length and girth without iatrogenic bleeding. 3. Patient to continue soft, supportive shoe gear. 4. Patient to report any pedal injuries to medical professional  5. Follow up 3 months.  6. Patient/POA to call should there be a concern in the interim.

## 2018-10-22 DIAGNOSIS — E890 Postprocedural hypothyroidism: Secondary | ICD-10-CM | POA: Diagnosis not present

## 2018-10-22 DIAGNOSIS — E782 Mixed hyperlipidemia: Secondary | ICD-10-CM | POA: Diagnosis not present

## 2018-10-22 DIAGNOSIS — Z7984 Long term (current) use of oral hypoglycemic drugs: Secondary | ICD-10-CM | POA: Diagnosis not present

## 2018-10-22 DIAGNOSIS — R350 Frequency of micturition: Secondary | ICD-10-CM | POA: Diagnosis not present

## 2018-10-22 DIAGNOSIS — I10 Essential (primary) hypertension: Secondary | ICD-10-CM | POA: Diagnosis not present

## 2018-10-22 DIAGNOSIS — E1140 Type 2 diabetes mellitus with diabetic neuropathy, unspecified: Secondary | ICD-10-CM | POA: Diagnosis not present

## 2018-10-22 DIAGNOSIS — G40909 Epilepsy, unspecified, not intractable, without status epilepticus: Secondary | ICD-10-CM | POA: Diagnosis not present

## 2018-10-22 DIAGNOSIS — R3989 Other symptoms and signs involving the genitourinary system: Secondary | ICD-10-CM | POA: Diagnosis not present

## 2018-11-24 ENCOUNTER — Other Ambulatory Visit: Payer: Self-pay

## 2018-11-24 ENCOUNTER — Encounter: Payer: Self-pay | Admitting: Podiatry

## 2018-11-24 ENCOUNTER — Ambulatory Visit: Payer: Medicare HMO | Admitting: Podiatry

## 2018-11-24 DIAGNOSIS — M79675 Pain in left toe(s): Secondary | ICD-10-CM

## 2018-11-24 DIAGNOSIS — M2042 Other hammer toe(s) (acquired), left foot: Secondary | ICD-10-CM

## 2018-11-24 DIAGNOSIS — B351 Tinea unguium: Secondary | ICD-10-CM

## 2018-11-24 DIAGNOSIS — M2041 Other hammer toe(s) (acquired), right foot: Secondary | ICD-10-CM

## 2018-11-24 DIAGNOSIS — M79674 Pain in right toe(s): Secondary | ICD-10-CM | POA: Diagnosis not present

## 2018-11-24 DIAGNOSIS — E1142 Type 2 diabetes mellitus with diabetic polyneuropathy: Secondary | ICD-10-CM | POA: Diagnosis not present

## 2018-11-24 NOTE — Patient Instructions (Signed)
Diabetic Neuropathy Diabetic neuropathy refers to nerve damage that is caused by diabetes (diabetes mellitus). Over time, people with diabetes can develop nerve damage throughout the body. There are several types of diabetic neuropathy:  Peripheral neuropathy. This is the most common type of diabetic neuropathy. It causes damage to nerves that carry signals between the spinal cord and other parts of the body (peripheral nerves). This usually affects nerves in the feet and legs first, and may eventually affect the hands and arms. The damage affects the ability to sense touch or temperature.  Autonomic neuropathy. This type causes damage to nerves that control involuntary functions (autonomic nerves). These nerves carry signals that control: ? Heartbeat. ? Body temperature. ? Blood pressure. ? Urination. ? Digestion. ? Sweating. ? Sexual function. ? Response to changing blood sugar (glucose) levels.  Focal neuropathy. This type of nerve damage affects one area of the body, such as an arm, a leg, or the face. The injury may involve one nerve or a small group of nerves. Focal neuropathy can be painful and unpredictable, and occurs most often in older adults with diabetes. This often develops suddenly, but usually improves over time and does not cause long-term problems.  Proximal neuropathy. This type of nerve damage affects the nerves of the thighs, hips, buttocks, or legs. It causes severe pain, weakness, and muscle death (atrophy), usually in the thigh muscles. It is more common among older men and people who have type 2 diabetes. The length of recovery time may vary. What are the causes? Peripheral, autonomic, and focal neuropathies are caused by diabetes that is not well controlled with treatment. The cause of proximal neuropathy is not known, but it may be caused by inflammation related to uncontrolled blood glucose levels. What are the signs or symptoms? Peripheral neuropathy Peripheral  neuropathy develops slowly over time. When the nerves of the feet and legs no longer work, you may experience:  Burning, stabbing, or aching pain in the legs or feet.  Pain or cramping in the legs or feet.  Loss of feeling (numbness) and inability to feel pressure or pain in the feet. This can lead to: ? Thick calluses or sores on areas of constant pressure. ? Ulcers. ? Reduced ability to feel temperature changes.  Foot deformities.  Muscle weakness.  Loss of balance or coordination. Autonomic neuropathy The symptoms of autonomic neuropathy vary depending on which nerves are affected. Symptoms may include:  Problems with digestion, such as: ? Nausea or vomiting. ? Poor appetite. ? Bloating. ? Diarrhea or constipation. ? Trouble swallowing. ? Losing weight without trying to.  Problems with the heart, blood and lungs, such as: ? Dizziness, especially when standing up. ? Fainting. ? Shortness of breath. ? Irregular heartbeat.  Bladder problems, such as: ? Trouble starting or stopping urination. ? Leaking urine. ? Trouble emptying the bladder. ? Urinary tract infections (UTIs).  Problems with other body functions, such as: ? Sweat. You may sweat too much or too little. ? Temperature. You might get hot easily. Or, you might feel cold more than usual. ? Sexual function. Men may not be able to get or maintain an erection. Women may have vaginal dryness and difficulty with arousal. Focal neuropathy Symptoms affect only one area of the body. Common symptoms include:  Numbness.  Tingling.  Burning pain.  Prickling feeling.  Very sensitive skin.  Weakness.  Inability to move (paralysis).  Muscle twitching.  Muscles getting smaller (wasting).  Poor coordination.  Double or blurred vision. Proximal   neuropathy  Sudden, severe pain in the hip, thigh, or buttocks. Pain may spread from the back into the legs (sciatica).  Pain and numbness in the arms and legs.   Tingling.  Loss of bladder control or bowel control.  Weakness and wasting of thigh muscles.  Difficulty getting up from a seated position.  Abdominal swelling.  Unexplained weight loss. How is this diagnosed? Diagnosis usually involves reviewing your medical history and any symptoms you have. Diagnosis varies depending on the type of neuropathy your health care provider suspects. Peripheral neuropathy Your health care provider will check areas that are affected by your nervous system (neurologic exam), such as your reflexes, how you move, and what you can feel. You may have other tests, such as:  Blood tests.  Removal and examination of fluid that surrounds the spinal cord (lumbar puncture).  CT scan.  MRI.  A test to check the nerves that control muscles (electromyogram, EMG).  Tests of how quickly messages pass through your nerves (nerve conduction velocity tests).  Removal of a small piece of nerve to be examined under a microscope (biopsy). Autonomic neuropathy You may have tests, such as:  Tests to measure your blood pressure and heart rate. This may include monitoring you while you are safely secured to an exam table that moves you from a lying position to an upright position (table tilt test).  Breathing tests to check your lungs.  Tests to check how food moves through the digestive system (gastric emptying tests).  Blood, sweat, or urine tests.  Ultrasound of your bladder.  Spinal fluid tests. Focal neuropathy This condition may be diagnosed with:  A neurologic exam.  CT scan.  MRI.  EMG.  Nerve conduction velocity tests. Proximal neuropathy There is no test to diagnose this type of neuropathy. You may have tests to rule out other possible causes of this type of neuropathy. Tests may include:  X-rays of your spine and lumbar region.  Lumbar puncture.  MRI. How is this treated? The goal of treatment is to keep nerve damage from getting worse.  The most important part of treatment is keeping your blood glucose level and your A1C level within your target range by following your diabetes management plan. Over time, maintaining lower blood glucose levels helps lessen symptoms. In some cases, you may need prescription pain medicine. Follow these instructions at home:  Lifestyle   Do not use any products that contain nicotine or tobacco, such as cigarettes and e-cigarettes. If you need help quitting, ask your health care provider.  Be physically active every day. Include strength training and balance exercises.  Follow a healthy meal plan.  Work with your health care provider to manage your blood pressure. General instructions  Follow your diabetes management plan as directed. ? Check your blood glucose levels as directed by your health care provider. ? Keep your blood glucose in your target range as directed by your health care provider. ? Have your A1C level checked at least two times a year, or as often as told by your health care provider.  Take over the counter and prescription medicines only as told by your health care provider. This includes insulin and diabetes medicine.  Do not drive or use heavy machinery while taking prescription pain medicines.  Check your skin and feet every day for cuts, bruises, redness, blisters, or sores.  Keep all follow up visits as told by your health care provider. This is important. Contact a health care provider if:    You have burning, stabbing, or aching pain in your legs or feet.  You are unable to feel pressure or pain in your feet.  You develop problems with digestion, such as: ? Nausea. ? Vomiting. ? Bloating. ? Constipation. ? Diarrhea. ? Abdominal pain.  You have difficulty with urination, such as inability: ? To control when you urinate (incontinence). ? To completely empty the bladder (retention).  You have palpitations.  You feel dizzy, weak, or faint when you stand  up. Get help right away if:  You cannot urinate.  You have sudden weakness or loss of coordination.  You have trouble speaking.  You have pain or pressure in your chest.  You have an irregular heart beat.  You have sudden inability to move a part of your body. Summary  Diabetic neuropathy refers to nerve damage that is caused by diabetes. It can affect nerves throughout the entire body, causing numbness and pain in the arms, legs, digestive tract, heart, and other body systems.  Keep your blood glucose level and your blood pressure in your target range, as directed by your health care provider. This can help prevent neuropathy from getting worse.  Check your skin and feet every day for cuts, bruises, redness, blisters, or sores.  Do not use any products that contain nicotine or tobacco, such as cigarettes and e-cigarettes. If you need help quitting, ask your health care provider. This information is not intended to replace advice given to you by your health care provider. Make sure you discuss any questions you have with your health care provider. Document Released: 06/24/2001 Document Revised: 05/28/2017 Document Reviewed: 05/20/2016 Elsevier Patient Education  2020 [employer]  Onychomycosis/Fungal Toenails  WHAT IS IT? An infection that lies within the keratin of your nail plate that is caused by a fungus.  WHY ME? Fungal infections affect all ages, sexes, races, and creeds.  There may be many factors that predispose you to a fungal infection such as age, coexisting medical conditions such as diabetes, or an autoimmune disease; stress, medications, fatigue, genetics, etc.  Bottom line: fungus thrives in a warm, moist environment and your shoes offer such a location.  IS IT CONTAGIOUS? Theoretically, yes.  You do not want to share shoes, nail clippers or files with someone who has fungal toenails.  Walking around barefoot in the same room or sleeping in the same bed is unlikely  to transfer the organism.  It is important to realize, however, that fungus can spread easily from one nail to the next on the same foot.  HOW DO WE TREAT THIS?  There are several ways to treat this condition.  Treatment may depend on many factors such as age, medications, pregnancy, liver and kidney conditions, etc.  It is best to ask your doctor which options are available to you.  1. No treatment.   Unlike many other medical concerns, you can live with this condition.  However for many people this can be a painful condition and may lead to ingrown toenails or a bacterial infection.  It is recommended that you keep the nails cut short to help reduce the amount of fungal nail. 2. Topical treatment.  These range from herbal remedies to prescription strength nail lacquers.  About 40-50% effective, topicals require twice daily application for approximately 9 to 12 months or until an entirely new nail has grown out.  The most effective topicals are medical grade medications available through physicians offices. 3. Oral antifungal medications.  With an 80-90% cure   rate, the most common oral medication requires 3 to 4 months of therapy and stays in your system for a year as the new nail grows out.  Oral antifungal medications do require blood work to make sure it is a safe drug for you.  A liver function panel will be performed prior to starting the medication and after the first month of treatment.  It is important to have the blood work performed to avoid any harmful side effects.  In general, this medication safe but blood work is required. 4. Laser Therapy.  This treatment is performed by applying a specialized laser to the affected nail plate.  This therapy is noninvasive, fast, and non-painful.  It is not covered by insurance and is therefore, out of pocket.  The results have been very good with a 80-95% cure rate.  [facility] is the only practice in the area to offer this therapy. 5. Permanent  Nail Avulsion.  Removing the entire nail so that a new nail will not grow back. 

## 2018-11-26 NOTE — Progress Notes (Signed)
Subjective:  Beth Lawson presents to clinic today with cc of  painful, thick, discolored, elongated toenails 1-5 b/l that become tender and cannot cut because of thickness. Pain is aggravated when wearing enclosed shoe gear.  Billie Ruddy, MD is her PCP.    Current Outpatient Medications:  .  amLODipine (NORVASC) 10 MG tablet, Take 1 tablet (10 mg total) by mouth daily., Disp: 90 tablet, Rfl: 2 .  amoxicillin-clavulanate (AUGMENTIN) 875-125 MG tablet, , Disp: , Rfl:  .  aspirin 81 MG tablet, Take 81 mg by mouth daily., Disp: , Rfl:  .  atorvastatin (LIPITOR) 80 MG tablet, Take 1 tablet (80 mg total) by mouth daily., Disp: 90 tablet, Rfl: 1 .  clotrimazole-betamethasone (LOTRISONE) cream, Apply to scaly skin on both feet and ankles twice daily for 4 weeks, Disp: 30 g, Rfl: 1 .  furosemide (LASIX) 40 MG tablet, Take 40 mg by mouth daily as needed., Disp: , Rfl:  .  levETIRAcetam (KEPPRA) 500 MG tablet, Take 1 tablet (500 mg total) by mouth 2 (two) times daily., Disp: 180 tablet, Rfl: 2 .  levothyroxine (SYNTHROID, LEVOTHROID) 150 MCG tablet, TAKE 1 TABLET (150 MCG TOTAL) BY MOUTH DAILY BEFORE BREAKFAST., Disp: 30 tablet, Rfl: 0 .  losartan-hydrochlorothiazide (HYZAAR) 100-12.5 MG tablet, TAKE 1 TABLET BY MOUTH EVERY DAY, Disp: 90 tablet, Rfl: 0 .  metFORMIN (GLUCOPHAGE) 500 MG tablet, Take 1 tablet (500 mg total) by mouth 2 (two) times daily with a meal., Disp: 180 tablet, Rfl: 2 .  metoprolol succinate (TOPROL-XL) 100 MG 24 hr tablet, Take 1 tablet (100 mg total) by mouth daily. Take with or immediately following a meal., Disp: 90 tablet, Rfl: 2 .  Multiple Vitamins-Minerals (CENTRUM SILVER ADULT 50+ PO), Take 1 tablet by mouth daily., Disp: , Rfl:  .  nitrofurantoin, macrocrystal-monohydrate, (MACROBID) 100 MG capsule, , Disp: , Rfl:    No Known Allergies   Objective: Physical Examination:  Vascular Examination: Capillary refill time <3 seconds x 10 digits.  DP pulses 2/4  b/l.  PT pulses 1/4 b/l.  Digital hair present b/l.  Bilateral LE edema. No pain with calf compression b/l.  Skin temperature gradient WNL b/l.  Dermatological Examination: Skin with normal turgor, texture and tone b/l.  No open wounds b/l.  No interdigital macerations noted b/l.  Elongated, thick, discolored brittle toenails with subungual debris and pain on dorsal palpation of nailbeds 1-5 b/l.  No hyperkeratotic lesions noted.   Musculoskeletal Examination: Muscle strength 5/5 to all muscle groups b/l.  Hammeroes 2-5 b/l.  No pain, crepitus or joint discomfort with active/passive ROM.  Neurological Examination: Sensation diminished b/l with 10 gram monofilament.  Assessment: Mycotic nail infection with pain 1-5 b/l NIDDM with neuropathy  Plan: 1. Toenails 1-5 b/l were debrided in length and girth without iatrogenic laceration. 2.  Continue soft, supportive shoe gear daily. 3.  Report any pedal injuries to medical professional. 4.  Follow up 3 months. 5.  Patient/POA to call should there be a question/concern in there interim.

## 2019-02-23 ENCOUNTER — Ambulatory Visit: Payer: Medicare HMO | Admitting: Podiatry

## 2019-02-23 ENCOUNTER — Encounter: Payer: Self-pay | Admitting: Podiatry

## 2019-02-23 ENCOUNTER — Other Ambulatory Visit: Payer: Self-pay

## 2019-02-23 DIAGNOSIS — M79674 Pain in right toe(s): Secondary | ICD-10-CM

## 2019-02-23 DIAGNOSIS — B351 Tinea unguium: Secondary | ICD-10-CM

## 2019-02-23 DIAGNOSIS — M79675 Pain in left toe(s): Secondary | ICD-10-CM | POA: Diagnosis not present

## 2019-02-23 NOTE — Patient Instructions (Signed)

## 2019-02-25 NOTE — Progress Notes (Signed)
Subjective: Beth Lawson is seen today for preventative diabetic foot care follow up painful, elongated, thickened toenails 1-5 b/l feet that she cannot cut. Pain interferes with daily activities. Aggravating factor includes wearing enclosed shoe gear and relieved with periodic debridement.  She states she has been applying Foot Miracle Cream on her feet and the dry scales have mostly resolved. She is pleased with the appearance of her feet.   She voices no new pedal concerns on today's visit.  Current Outpatient Medications on File Prior to Visit  Medication Sig  . amLODipine (NORVASC) 10 MG tablet Take 1 tablet (10 mg total) by mouth daily.  Marland Kitchen amoxicillin-clavulanate (AUGMENTIN) 875-125 MG tablet   . aspirin 81 MG tablet Take 81 mg by mouth daily.  Marland Kitchen atorvastatin (LIPITOR) 80 MG tablet Take 1 tablet (80 mg total) by mouth daily.  . clotrimazole-betamethasone (LOTRISONE) cream Apply to scaly skin on both feet and ankles twice daily for 4 weeks  . furosemide (LASIX) 40 MG tablet Take 40 mg by mouth daily as needed.  . levETIRAcetam (KEPPRA) 500 MG tablet Take 1 tablet (500 mg total) by mouth 2 (two) times daily.  Marland Kitchen levothyroxine (SYNTHROID, LEVOTHROID) 150 MCG tablet TAKE 1 TABLET (150 MCG TOTAL) BY MOUTH DAILY BEFORE BREAKFAST.  Marland Kitchen losartan-hydrochlorothiazide (HYZAAR) 100-12.5 MG tablet TAKE 1 TABLET BY MOUTH EVERY DAY  . metFORMIN (GLUCOPHAGE) 500 MG tablet Take 1 tablet (500 mg total) by mouth 2 (two) times daily with a meal.  . metoprolol succinate (TOPROL-XL) 100 MG 24 hr tablet Take 1 tablet (100 mg total) by mouth daily. Take with or immediately following a meal.  . Multiple Vitamins-Minerals (CENTRUM SILVER ADULT 50+ PO) Take 1 tablet by mouth daily.  . nitrofurantoin, macrocrystal-monohydrate, (MACROBID) 100 MG capsule    No current facility-administered medications on file prior to visit.      No Known Allergies   Objective:  Vascular Examination: Capillary refill time <3  seconds b/l.   Dorsalis pedis 2/4 b/l.  Posterior tibial pulses 1/4 b/l.  Digital hair present b/l.  Skin temperature gradient WNL b/l.   Edema noted b/l LE. Wearing compression knee highs.   No pain with calf compression b/l.  Dermatological Examination: Skin with normal turgor, texture and tone b/l.  Toenails 1-5 b/l discolored, thick, dystrophic with subungual debris and pain with palpation to nailbeds due to thickness of nails.  Musculoskeletal: Muscle strength 5/5 to all LE muscle groups b/l.   Hammertoes 2-5 b/l.   No pain, crepitus or joint limitation noted with ROM.   Neurological Examination: Protective sensation diminished with 10 gram monofilament bilaterally.  Assessment: Painful onychomycosis toenails 1-5 b/l  NIDDM with neuropathy  Plan: 1. Toenails 1-5 b/l were debrided in length and girth without iatrogenic bleeding. 2. Patient to continue soft, supportive shoe gear. 3. Patient to report any pedal injuries to medical professional immediately. 4. Follow up 3 months.  5. Patient/POA to call should there be a concern in the interim.

## 2019-03-12 DIAGNOSIS — I10 Essential (primary) hypertension: Secondary | ICD-10-CM | POA: Diagnosis not present

## 2019-03-12 DIAGNOSIS — R011 Cardiac murmur, unspecified: Secondary | ICD-10-CM | POA: Diagnosis not present

## 2019-03-12 DIAGNOSIS — E782 Mixed hyperlipidemia: Secondary | ICD-10-CM | POA: Diagnosis not present

## 2019-03-12 DIAGNOSIS — E1140 Type 2 diabetes mellitus with diabetic neuropathy, unspecified: Secondary | ICD-10-CM | POA: Diagnosis not present

## 2019-03-12 DIAGNOSIS — E890 Postprocedural hypothyroidism: Secondary | ICD-10-CM | POA: Diagnosis not present

## 2019-03-12 DIAGNOSIS — D649 Anemia, unspecified: Secondary | ICD-10-CM | POA: Diagnosis not present

## 2019-03-12 DIAGNOSIS — G40909 Epilepsy, unspecified, not intractable, without status epilepticus: Secondary | ICD-10-CM | POA: Diagnosis not present

## 2019-03-12 DIAGNOSIS — Z0000 Encounter for general adult medical examination without abnormal findings: Secondary | ICD-10-CM | POA: Diagnosis not present

## 2019-03-22 DIAGNOSIS — I10 Essential (primary) hypertension: Secondary | ICD-10-CM | POA: Diagnosis not present

## 2019-03-22 DIAGNOSIS — R011 Cardiac murmur, unspecified: Secondary | ICD-10-CM | POA: Diagnosis not present

## 2019-03-22 DIAGNOSIS — E782 Mixed hyperlipidemia: Secondary | ICD-10-CM | POA: Diagnosis not present

## 2019-03-22 DIAGNOSIS — E890 Postprocedural hypothyroidism: Secondary | ICD-10-CM | POA: Diagnosis not present

## 2019-03-22 DIAGNOSIS — Z0000 Encounter for general adult medical examination without abnormal findings: Secondary | ICD-10-CM | POA: Diagnosis not present

## 2019-03-22 DIAGNOSIS — E1140 Type 2 diabetes mellitus with diabetic neuropathy, unspecified: Secondary | ICD-10-CM | POA: Diagnosis not present

## 2019-03-22 DIAGNOSIS — G40909 Epilepsy, unspecified, not intractable, without status epilepticus: Secondary | ICD-10-CM | POA: Diagnosis not present

## 2019-03-22 DIAGNOSIS — Z23 Encounter for immunization: Secondary | ICD-10-CM | POA: Diagnosis not present

## 2019-06-01 ENCOUNTER — Other Ambulatory Visit: Payer: Self-pay

## 2019-06-01 ENCOUNTER — Ambulatory Visit: Payer: Medicare HMO | Admitting: Podiatry

## 2019-06-01 ENCOUNTER — Encounter: Payer: Self-pay | Admitting: Podiatry

## 2019-06-01 DIAGNOSIS — B351 Tinea unguium: Secondary | ICD-10-CM

## 2019-06-01 DIAGNOSIS — M79674 Pain in right toe(s): Secondary | ICD-10-CM | POA: Diagnosis not present

## 2019-06-01 DIAGNOSIS — E1142 Type 2 diabetes mellitus with diabetic polyneuropathy: Secondary | ICD-10-CM | POA: Diagnosis not present

## 2019-06-01 DIAGNOSIS — M79675 Pain in left toe(s): Secondary | ICD-10-CM | POA: Diagnosis not present

## 2019-06-01 NOTE — Patient Instructions (Signed)
Diabetes Mellitus and Foot Care Foot care is an important part of your health, especially when you have diabetes. Diabetes may cause you to have problems because of poor blood flow (circulation) to your feet and legs, which can cause your skin to:  Become thinner and drier.  Break more easily.  Heal more slowly.  Peel and crack. You may also have nerve damage (neuropathy) in your legs and feet, causing decreased feeling in them. This means that you may not notice minor injuries to your feet that could lead to more serious problems. Noticing and addressing any potential problems early is the best way to prevent future foot problems. How to care for your feet Foot hygiene  Wash your feet daily with warm water and mild soap. Do not use hot water. Then, pat your feet and the areas between your toes until they are completely dry. Do not soak your feet as this can dry your skin.  Trim your toenails straight across. Do not dig under them or around the cuticle. File the edges of your nails with an emery board or nail file.  Apply a moisturizing lotion or petroleum jelly to the skin on your feet and to dry, brittle toenails. Use lotion that does not contain alcohol and is unscented. Do not apply lotion between your toes. Shoes and socks  Wear clean socks or stockings every day. Make sure they are not too tight. Do not wear knee-high stockings since they may decrease blood flow to your legs.  Wear shoes that fit properly and have enough cushioning. Always look in your shoes before you put them on to be sure there are no objects inside.  To break in new shoes, wear them for just a few hours a day. This prevents injuries on your feet. Wounds, scrapes, corns, and calluses  Check your feet daily for blisters, cuts, bruises, sores, and redness. If you cannot see the bottom of your feet, use a mirror or ask someone for help.  Do not cut corns or calluses or try to remove them with medicine.  If you  find a minor scrape, cut, or break in the skin on your feet, keep it and the skin around it clean and dry. You may clean these areas with mild soap and water. Do not clean the area with peroxide, alcohol, or iodine.  If you have a wound, scrape, corn, or callus on your foot, look at it several times a day to make sure it is healing and not infected. Check for: ? Redness, swelling, or pain. ? Fluid or blood. ? Warmth. ? Pus or a bad smell. General instructions  Do not cross your legs. This may decrease blood flow to your feet.  Do not use heating pads or hot water bottles on your feet. They may burn your skin. If you have lost feeling in your feet or legs, you may not know this is happening until it is too late.  Protect your feet from hot and cold by wearing shoes, such as at the beach or on hot pavement.  Schedule a complete foot exam at least once a year (annually) or more often if you have foot problems. If you have foot problems, report any cuts, sores, or bruises to your health care provider immediately. Contact a health care provider if:  You have a medical condition that increases your risk of infection and you have any cuts, sores, or bruises on your feet.  You have an injury that is not   healing.  You have redness on your legs or feet.  You feel burning or tingling in your legs or feet.  You have pain or cramps in your legs and feet.  Your legs or feet are numb.  Your feet always feel cold.  You have pain around a toenail. Get help right away if:  You have a wound, scrape, corn, or callus on your foot and: ? You have pain, swelling, or redness that gets worse. ? You have fluid or blood coming from the wound, scrape, corn, or callus. ? Your wound, scrape, corn, or callus feels warm to the touch. ? You have pus or a bad smell coming from the wound, scrape, corn, or callus. ? You have a fever. ? You have a red line going up your leg. Summary  Check your feet every day  for cuts, sores, red spots, swelling, and blisters.  Moisturize feet and legs daily.  Wear shoes that fit properly and have enough cushioning.  If you have foot problems, report any cuts, sores, or bruises to your health care provider immediately.  Schedule a complete foot exam at least once a year (annually) or more often if you have foot problems. This information is not intended to replace advice given to you by your health care provider. Make sure you discuss any questions you have with your health care provider. Document Revised: 01/06/2019 Document Reviewed: 05/17/2016 Elsevier Patient Education  2020 [employer] Inc[employer]  

## 2019-06-05 NOTE — Progress Notes (Signed)
Subjective: Beth Lawson presents today for follow up of preventative diabetic foot care and painful mycotic nails b/l that are difficult to trim. Pain interferes with ambulation. Aggravating factors include wearing enclosed shoe gear. Pain is relieved with periodic professional debridement.   She is inquiring about diabetic shoes.   No Known Allergies   Objective: There were no vitals filed for this visit.  Vascular Examination:  Capillary fill time to digits <3s b/l, palpable DP pulses b/l, faintly palpable PT pulses b/l, pedal hair sparse b/l, skin temperature gradient within normal limits b/l and nonpitting edema noted b/l LE  Dermatological Examination: Pedal skin with normal turgor, texture and tone bilaterally, no open wounds bilaterally, no interdigital macerations bilaterally and toenails 1-5 b/l elongated, dystrophic, thickened, crumbly with subungual debris  Musculoskeletal: Normal muscle strength 5/5 to all lower extremity muscle groups bilaterally, no pain crepitus or joint limitation noted with ROM b/l and hammertoes noted to the  2-5 bilaterally  Neurological: Protective sensation absent with 10g monofilament b/l  Assessment: Pain due to onychomycosis of toenails of both feet  Diabetic peripheral neuropathy associated with type 2 diabetes mellitus (HCC)  Plan: -Per Medicare guidelines, patient's feet need to be evaluated by an MD/DO managing patient's diabetes and diabetic shoe certification form needs to be signed by the MD/DO. If patient's diabetes is being managed by an Endocrinologist, the Endocrinologist must evaluate patient's feet and sign the Medicare diabetic shoe certification form. Wrote information on prescription form for her and her husband to give to her PCP's office so they may assist them in getting help with this. -Continue diabetic foot care principles. Literature dispensed on today.  -Toenails 1-5 b/l were debrided in length and girth without  iatrogenic bleeding. -Patient to continue soft, supportive shoe gear daily. -Patient to report any pedal injuries to medical professional immediately. -Patient/POA to call should there be question/concern in the interim.  Return in about 3 months (around 08/29/2019) for diabetic nail trim.

## 2019-06-13 ENCOUNTER — Ambulatory Visit: Payer: Medicare HMO

## 2019-07-27 DIAGNOSIS — H2513 Age-related nuclear cataract, bilateral: Secondary | ICD-10-CM | POA: Diagnosis not present

## 2019-07-27 DIAGNOSIS — H1013 Acute atopic conjunctivitis, bilateral: Secondary | ICD-10-CM | POA: Diagnosis not present

## 2019-07-27 DIAGNOSIS — H02831 Dermatochalasis of right upper eyelid: Secondary | ICD-10-CM | POA: Diagnosis not present

## 2019-08-04 DIAGNOSIS — Z7984 Long term (current) use of oral hypoglycemic drugs: Secondary | ICD-10-CM | POA: Diagnosis not present

## 2019-08-04 DIAGNOSIS — E1140 Type 2 diabetes mellitus with diabetic neuropathy, unspecified: Secondary | ICD-10-CM | POA: Diagnosis not present

## 2019-08-04 DIAGNOSIS — E890 Postprocedural hypothyroidism: Secondary | ICD-10-CM | POA: Diagnosis not present

## 2019-08-04 DIAGNOSIS — E782 Mixed hyperlipidemia: Secondary | ICD-10-CM | POA: Diagnosis not present

## 2019-08-04 DIAGNOSIS — E039 Hypothyroidism, unspecified: Secondary | ICD-10-CM | POA: Diagnosis not present

## 2019-08-04 DIAGNOSIS — I10 Essential (primary) hypertension: Secondary | ICD-10-CM | POA: Diagnosis not present

## 2019-08-10 DIAGNOSIS — E039 Hypothyroidism, unspecified: Secondary | ICD-10-CM | POA: Diagnosis not present

## 2019-08-10 DIAGNOSIS — D649 Anemia, unspecified: Secondary | ICD-10-CM | POA: Diagnosis not present

## 2019-08-10 DIAGNOSIS — R809 Proteinuria, unspecified: Secondary | ICD-10-CM | POA: Diagnosis not present

## 2019-08-10 DIAGNOSIS — E1140 Type 2 diabetes mellitus with diabetic neuropathy, unspecified: Secondary | ICD-10-CM | POA: Diagnosis not present

## 2019-08-10 DIAGNOSIS — E782 Mixed hyperlipidemia: Secondary | ICD-10-CM | POA: Diagnosis not present

## 2019-08-10 DIAGNOSIS — Z6841 Body Mass Index (BMI) 40.0 and over, adult: Secondary | ICD-10-CM | POA: Diagnosis not present

## 2019-08-10 DIAGNOSIS — I10 Essential (primary) hypertension: Secondary | ICD-10-CM | POA: Diagnosis not present

## 2019-08-10 DIAGNOSIS — R600 Localized edema: Secondary | ICD-10-CM | POA: Diagnosis not present

## 2019-08-10 DIAGNOSIS — Z7984 Long term (current) use of oral hypoglycemic drugs: Secondary | ICD-10-CM | POA: Diagnosis not present

## 2019-09-01 ENCOUNTER — Encounter: Payer: Self-pay | Admitting: Podiatry

## 2019-09-01 ENCOUNTER — Ambulatory Visit: Payer: Medicare HMO | Admitting: Podiatry

## 2019-09-01 ENCOUNTER — Other Ambulatory Visit: Payer: Self-pay

## 2019-09-01 VITALS — Temp 96.0°F

## 2019-09-01 DIAGNOSIS — E1142 Type 2 diabetes mellitus with diabetic polyneuropathy: Secondary | ICD-10-CM

## 2019-09-01 DIAGNOSIS — B351 Tinea unguium: Secondary | ICD-10-CM

## 2019-09-01 DIAGNOSIS — M79674 Pain in right toe(s): Secondary | ICD-10-CM

## 2019-09-01 DIAGNOSIS — M79675 Pain in left toe(s): Secondary | ICD-10-CM | POA: Diagnosis not present

## 2019-09-01 NOTE — Patient Instructions (Signed)
Diabetes Mellitus and Foot Care Foot care is an important part of your health, especially when you have diabetes. Diabetes may cause you to have problems because of poor blood flow (circulation) to your feet and legs, which can cause your skin to:  Become thinner and drier.  Break more easily.  Heal more slowly.  Peel and crack. You may also have nerve damage (neuropathy) in your legs and feet, causing decreased feeling in them. This means that you may not notice minor injuries to your feet that could lead to more serious problems. Noticing and addressing any potential problems early is the best way to prevent future foot problems. How to care for your feet Foot hygiene  Wash your feet daily with warm water and mild soap. Do not use hot water. Then, pat your feet and the areas between your toes until they are completely dry. Do not soak your feet as this can dry your skin.  Trim your toenails straight across. Do not dig under them or around the cuticle. File the edges of your nails with an emery board or nail file.  Apply a moisturizing lotion or petroleum jelly to the skin on your feet and to dry, brittle toenails. Use lotion that does not contain alcohol and is unscented. Do not apply lotion between your toes. Shoes and socks  Wear clean socks or stockings every day. Make sure they are not too tight. Do not wear knee-high stockings since they may decrease blood flow to your legs.  Wear shoes that fit properly and have enough cushioning. Always look in your shoes before you put them on to be sure there are no objects inside.  To break in new shoes, wear them for just a few hours a day. This prevents injuries on your feet. Wounds, scrapes, corns, and calluses  Check your feet daily for blisters, cuts, bruises, sores, and redness. If you cannot see the bottom of your feet, use a mirror or ask someone for help.  Do not cut corns or calluses or try to remove them with medicine.  If you  find a minor scrape, cut, or break in the skin on your feet, keep it and the skin around it clean and dry. You may clean these areas with mild soap and water. Do not clean the area with peroxide, alcohol, or iodine.  If you have a wound, scrape, corn, or callus on your foot, look at it several times a day to make sure it is healing and not infected. Check for: ? Redness, swelling, or pain. ? Fluid or blood. ? Warmth. ? Pus or a bad smell. General instructions  Do not cross your legs. This may decrease blood flow to your feet.  Do not use heating pads or hot water bottles on your feet. They may burn your skin. If you have lost feeling in your feet or legs, you may not know this is happening until it is too late.  Protect your feet from hot and cold by wearing shoes, such as at the beach or on hot pavement.  Schedule a complete foot exam at least once a year (annually) or more often if you have foot problems. If you have foot problems, report any cuts, sores, or bruises to your health care provider immediately. Contact a health care provider if:  You have a medical condition that increases your risk of infection and you have any cuts, sores, or bruises on your feet.  You have an injury that is not   healing.  You have redness on your legs or feet.  You feel burning or tingling in your legs or feet.  You have pain or cramps in your legs and feet.  Your legs or feet are numb.  Your feet always feel cold.  You have pain around a toenail. Get help right away if:  You have a wound, scrape, corn, or callus on your foot and: ? You have pain, swelling, or redness that gets worse. ? You have fluid or blood coming from the wound, scrape, corn, or callus. ? Your wound, scrape, corn, or callus feels warm to the touch. ? You have pus or a bad smell coming from the wound, scrape, corn, or callus. ? You have a fever. ? You have a red line going up your leg. Summary  Check your feet every day  for cuts, sores, red spots, swelling, and blisters.  Moisturize feet and legs daily.  Wear shoes that fit properly and have enough cushioning.  If you have foot problems, report any cuts, sores, or bruises to your health care provider immediately.  Schedule a complete foot exam at least once a year (annually) or more often if you have foot problems. This information is not intended to replace advice given to you by your health care provider. Make sure you discuss any questions you have with your health care provider. Document Revised: 01/06/2019 Document Reviewed: 05/17/2016 Elsevier Patient Education  2020 Else[employer] Inc.  Corns and Calluses Corns are small areas of thickened skin that occur on the top, sides, or tip of a toe. They contain a cone-shaped core with a point that can press on a nerve below. This causes pain.  Calluses are areas of thickened skin that can occur anywhere on the body, including the hands, fingers, palms, soles of the feet, and heels. Calluses are usually larger than corns. What are the causes? Corns and calluses are caused by rubbing (friction) or pressure, such as from shoes that are too tight or do not fit properly. What increases the risk? Corns are more likely to develop in people who have misshapen toes (toe deformities), such as hammer toes. Calluses can occur with friction to any area of the skin. They are more likely to develop in people who:  Work with their hands.  Wear shoes that fit poorly, are too tight, or are high-heeled.  Have toe deformities. What are the signs or symptoms? Symptoms of a corn or callus include:  A hard growth on the skin.  Pain or tenderness under the skin.  Redness and swelling.  Increased discomfort while wearing tight-fitting shoes, if your feet are affected. If a corn or callus becomes infected, symptoms may include:  Redness and swelling that gets worse.  Pain.  Fluid, blood, or pus draining from the corn or  callus. How is this diagnosed? Corns and calluses may be diagnosed based on your symptoms, your medical history, and a physical exam. How is this treated? Treatment for corns and calluses may include:  Removing the cause of the friction or pressure. This may involve: ? Changing your shoes. ? Wearing shoe inserts (orthotics) or other protective layers in your shoes, such as a corn pad. ? Wearing gloves.  Applying medicine to the skin (topical medicine) to help soften skin in the hardened, thickened areas.  Removing layers of dead skin with a file to reduce the size of the corn or callus.  Removing the corn or callus with a scalpel or laser.  Taking   antibiotic medicines, if your corn or callus is infected.  Having surgery, if a toe deformity is the cause. Follow these instructions at home:   Take over-the-counter and prescription medicines only as told by your health care provider.  If you were prescribed an antibiotic, take it as told by your health care provider. Do not stop taking it even if your condition starts to improve.  Wear shoes that fit well. Avoid wearing high-heeled shoes and shoes that are too tight or too loose.  Wear any padding, protective layers, gloves, or orthotics as told by your health care provider.  Soak your hands or feet and then use a file or pumice stone to soften your corn or callus. Do this as told by your health care provider.  Check your corn or callus every day for symptoms of infection. Contact a health care provider if you:  Notice that your symptoms do not improve with treatment.  Have redness or swelling that gets worse.  Notice that your corn or callus becomes painful.  Have fluid, blood, or pus coming from your corn or callus.  Have new symptoms. Summary  Corns are small areas of thickened skin that occur on the top, sides, or tip of a toe.  Calluses are areas of thickened skin that can occur anywhere on the body, including the  hands, fingers, palms, and soles of the feet. Calluses are usually larger than corns.  Corns and calluses are caused by rubbing (friction) or pressure, such as from shoes that are too tight or do not fit properly.  Treatment may include wearing any padding, protective layers, gloves, or orthotics as told by your health care provider. This information is not intended to replace advice given to you by your health care provider. Make sure you discuss any questions you have with your health care provider. Document Revised: 08/05/2018 Document Reviewed: 02/26/2017 Elsevier Patient Education  2020 Elsevier Inc.  

## 2019-09-01 NOTE — Progress Notes (Signed)
Subjective: Beth Lawson presents today for follow up of preventative diabetic foot care and painful mycotic nails b/l that are difficult to trim. Pain interferes with ambulation. Aggravating factors include wearing enclosed shoe gear. Pain is relieved with periodic professional debridement.   Pt is inquiring about her diabetic shoes.   No Known Allergies   Objective: Vitals:   09/01/19 1303  Temp: (!) 96 F (35.6 C)    Pt is a pleasant 79 y.o. year old AA female in NAD. AAO x 3.   Vascular Examination:  Capillary fill time to digits <3 seconds b/l. Faintly palpable pedal pulses b/l. Pedal hair present b/l. Pedal hair absent b/l Skin temperature gradient within normal limits b/l. +1 pitting edema b/l LE.  Dermatological Examination: Pedal skin with normal turgor, texture and tone bilaterally. No open wounds bilaterally. No interdigital macerations bilaterally. Toenails 1-5 b/l elongated, dystrophic, thickened, crumbly with subungual debris and tenderness to dorsal palpation.  Musculoskeletal: Normal muscle strength 5/5 to all lower extremity muscle groups bilaterally. No pain crepitus or joint limitation noted with ROM b/l. Hallux valgus with bunion deformity noted b/l. Hammertoes noted to the 2-5 bilaterally.  Neurological: Protective sensation diminished with 10g monofilament b/l. Vibratory sensation diminished b/l.  Assessment: 1. Pain due to onychomycosis of toenails of both feet   2. Diabetic peripheral neuropathy associated with type 2 diabetes mellitus (HCC)    Plan: -No new findings. No new orders. -Toenails 1-5 b/l were debrided in length and girth with sterile nail nippers and dremel without iatrogenic bleeding.  -Patient to continue soft, supportive shoe gear daily. -Patient to report any pedal injuries to medical professional immediately. -Patient will schedule appointment with Raiford Noble or Fenton Foy to be measured for diabetic shoes at her convenience. -Patient/POA to call  should there be question/concern in the interim.  Return in about 3 months (around 12/02/2019) for diabetic nail and callus trim.  [name], [name]

## 2019-09-10 ENCOUNTER — Other Ambulatory Visit: Payer: Medicare HMO | Admitting: Orthotics

## 2019-12-27 ENCOUNTER — Ambulatory Visit: Payer: Medicare HMO | Admitting: Podiatry

## 2019-12-28 ENCOUNTER — Ambulatory Visit: Payer: Medicare HMO | Admitting: Podiatry

## 2019-12-28 ENCOUNTER — Other Ambulatory Visit: Payer: Self-pay

## 2019-12-28 ENCOUNTER — Encounter: Payer: Self-pay | Admitting: Podiatry

## 2019-12-28 DIAGNOSIS — M79675 Pain in left toe(s): Secondary | ICD-10-CM

## 2019-12-28 DIAGNOSIS — M79674 Pain in right toe(s): Secondary | ICD-10-CM | POA: Diagnosis not present

## 2019-12-28 DIAGNOSIS — B351 Tinea unguium: Secondary | ICD-10-CM | POA: Diagnosis not present

## 2019-12-28 DIAGNOSIS — M2042 Other hammer toe(s) (acquired), left foot: Secondary | ICD-10-CM

## 2019-12-28 DIAGNOSIS — M2041 Other hammer toe(s) (acquired), right foot: Secondary | ICD-10-CM

## 2019-12-28 DIAGNOSIS — E1142 Type 2 diabetes mellitus with diabetic polyneuropathy: Secondary | ICD-10-CM

## 2019-12-28 NOTE — Progress Notes (Signed)
This patient returns to my office for at risk foot care.  This patient requires this care by a professional since this patient will be at risk due to having diabetes mellitus.  This patient is unable to cut nails herself since the patient cannot reach her nails.These nails are painful walking and wearing shoes.  This patient presents for at risk foot care today.  General Appearance  Alert, conversant and in no acute stress.  Vascular  Dorsalis pedis and posterior tibial  pulses are palpable  bilaterally.  Capillary return is within normal limits  bilaterally. Temperature is within normal limits  bilaterally.  Neurologic  Senn-Weinstein monofilament wire test within normal limits  bilaterally. Muscle power within normal limits bilaterally.  Nails Thick disfigured discolored nails with subungual debris  from hallux to fifth toes bilaterally. No evidence of bacterial infection or drainage bilaterally.  Orthopedic  No limitations of motion  feet .  No crepitus or effusions noted.  No bony pathology or digital deformities noted.  Skin  normotropic skin with no porokeratosis noted bilaterally.  No signs of infections or ulcers noted.     Onychomycosis  Pain in right toes  Pain in left toes  Consent was obtained for treatment procedures.   Mechanical debridement of nails 1-5  bilaterally performed with a nail nipper.  Filed with dremel without incident.    Return office visit   3 months                   Told patient to return for periodic foot care and evaluation due to potential at risk complications.   Alexxander Kurt [name]   

## 2019-12-29 DIAGNOSIS — Z0100 Encounter for examination of eyes and vision without abnormal findings: Secondary | ICD-10-CM | POA: Diagnosis not present

## 2020-02-24 DIAGNOSIS — E039 Hypothyroidism, unspecified: Secondary | ICD-10-CM | POA: Diagnosis not present

## 2020-02-24 DIAGNOSIS — E1140 Type 2 diabetes mellitus with diabetic neuropathy, unspecified: Secondary | ICD-10-CM | POA: Diagnosis not present

## 2020-02-24 DIAGNOSIS — E890 Postprocedural hypothyroidism: Secondary | ICD-10-CM | POA: Diagnosis not present

## 2020-02-24 DIAGNOSIS — I10 Essential (primary) hypertension: Secondary | ICD-10-CM | POA: Diagnosis not present

## 2020-02-24 DIAGNOSIS — E782 Mixed hyperlipidemia: Secondary | ICD-10-CM | POA: Diagnosis not present

## 2020-03-14 DIAGNOSIS — E782 Mixed hyperlipidemia: Secondary | ICD-10-CM | POA: Diagnosis not present

## 2020-03-14 DIAGNOSIS — G40909 Epilepsy, unspecified, not intractable, without status epilepticus: Secondary | ICD-10-CM | POA: Diagnosis not present

## 2020-03-14 DIAGNOSIS — Z7984 Long term (current) use of oral hypoglycemic drugs: Secondary | ICD-10-CM | POA: Diagnosis not present

## 2020-03-14 DIAGNOSIS — E890 Postprocedural hypothyroidism: Secondary | ICD-10-CM | POA: Diagnosis not present

## 2020-03-14 DIAGNOSIS — R011 Cardiac murmur, unspecified: Secondary | ICD-10-CM | POA: Diagnosis not present

## 2020-03-14 DIAGNOSIS — E1140 Type 2 diabetes mellitus with diabetic neuropathy, unspecified: Secondary | ICD-10-CM | POA: Diagnosis not present

## 2020-03-14 DIAGNOSIS — I10 Essential (primary) hypertension: Secondary | ICD-10-CM | POA: Diagnosis not present

## 2020-03-14 DIAGNOSIS — Z23 Encounter for immunization: Secondary | ICD-10-CM | POA: Diagnosis not present

## 2020-03-14 DIAGNOSIS — Z6841 Body Mass Index (BMI) 40.0 and over, adult: Secondary | ICD-10-CM | POA: Diagnosis not present

## 2020-03-14 DIAGNOSIS — Z0000 Encounter for general adult medical examination without abnormal findings: Secondary | ICD-10-CM | POA: Diagnosis not present

## 2020-03-14 DIAGNOSIS — D649 Anemia, unspecified: Secondary | ICD-10-CM | POA: Diagnosis not present

## 2020-04-07 ENCOUNTER — Other Ambulatory Visit: Payer: Self-pay

## 2020-04-07 ENCOUNTER — Ambulatory Visit: Payer: Medicare HMO | Admitting: Podiatry

## 2020-04-07 ENCOUNTER — Encounter: Payer: Self-pay | Admitting: Podiatry

## 2020-04-07 DIAGNOSIS — B351 Tinea unguium: Secondary | ICD-10-CM

## 2020-04-07 DIAGNOSIS — M79674 Pain in right toe(s): Secondary | ICD-10-CM | POA: Diagnosis not present

## 2020-04-07 DIAGNOSIS — M79675 Pain in left toe(s): Secondary | ICD-10-CM | POA: Diagnosis not present

## 2020-04-07 DIAGNOSIS — M2041 Other hammer toe(s) (acquired), right foot: Secondary | ICD-10-CM

## 2020-04-07 DIAGNOSIS — E1142 Type 2 diabetes mellitus with diabetic polyneuropathy: Secondary | ICD-10-CM

## 2020-04-07 DIAGNOSIS — M2042 Other hammer toe(s) (acquired), left foot: Secondary | ICD-10-CM

## 2020-04-14 DIAGNOSIS — G40909 Epilepsy, unspecified, not intractable, without status epilepticus: Secondary | ICD-10-CM | POA: Insufficient documentation

## 2020-04-14 DIAGNOSIS — E039 Hypothyroidism, unspecified: Secondary | ICD-10-CM | POA: Insufficient documentation

## 2020-04-14 DIAGNOSIS — E1142 Type 2 diabetes mellitus with diabetic polyneuropathy: Secondary | ICD-10-CM | POA: Insufficient documentation

## 2020-04-14 DIAGNOSIS — R600 Localized edema: Secondary | ICD-10-CM | POA: Insufficient documentation

## 2020-04-14 DIAGNOSIS — Z6841 Body Mass Index (BMI) 40.0 and over, adult: Secondary | ICD-10-CM | POA: Insufficient documentation

## 2020-04-14 DIAGNOSIS — E782 Mixed hyperlipidemia: Secondary | ICD-10-CM | POA: Insufficient documentation

## 2020-04-14 DIAGNOSIS — E890 Postprocedural hypothyroidism: Secondary | ICD-10-CM | POA: Insufficient documentation

## 2020-04-14 DIAGNOSIS — R32 Unspecified urinary incontinence: Secondary | ICD-10-CM | POA: Insufficient documentation

## 2020-04-14 DIAGNOSIS — R011 Cardiac murmur, unspecified: Secondary | ICD-10-CM | POA: Insufficient documentation

## 2020-04-14 DIAGNOSIS — I10 Essential (primary) hypertension: Secondary | ICD-10-CM | POA: Insufficient documentation

## 2020-04-14 NOTE — Progress Notes (Signed)
Subjective: Beth Lawson presents today for follow up of preventative diabetic foot care and painful mycotic nails b/l that are difficult to trim. Pain interferes with ambulation. Aggravating factors include wearing enclosed shoe gear. Pain is relieved with periodic professional debridement.   PCP is Peri Maris, FNP. Last visit was 03/18/2020.  She also wears compression hose b/l LE due to chronic edema.  No Known Allergies   Objective: There were no vitals filed for this visit.  Pt is a pleasant 79 y.o. year old AA female, morbidly obese,  in NAD. AAO x 3.   Vascular Examination:  Capillary fill time to digits <3 seconds b/l lower extremities. Faintly palpable pedal pulses b/l. Pedal hair present. Pedal hair absent. Lower extremity skin temperature gradient within normal limits. No pain with calf compression b/l. +1 pitting edema b/l lower extremities.  Dermatological Examination: Pedal skin with normal turgor, texture and tone bilaterally. No open wounds bilaterally. No interdigital macerations bilaterally. Toenails 1-5 b/l elongated, dystrophic, thickened, crumbly with subungual debris and tenderness to dorsal palpation.  Musculoskeletal: Normal muscle strength 5/5 to all lower extremity muscle groups bilaterally. No pain crepitus or joint limitation noted with ROM b/l. Hallux valgus with bunion deformity noted b/l. Hammertoes noted to the 2-5 bilaterally.  Neurological: Protective sensation diminished with 10g monofilament b/l. Vibratory sensation diminished b/l.  Assessment: 1. Pain due to onychomycosis of toenails of both feet   2. Acquired hammertoes of both feet   3. Diabetic peripheral neuropathy associated with type 2 diabetes mellitus (HCC)    Plan: -No new findings. No new orders. -Toenails 1-5 b/l were debrided in length and girth with sterile nail nippers and dremel without iatrogenic bleeding.  -Patient to continue soft, supportive shoe gear daily. -Patient to  report any pedal injuries to medical professional immediately. -Patient will schedule appointment with Raiford Noble or Fenton Foy to be measured for diabetic shoes at her convenience. -Patient/POA to call should there be question/concern in the interim.  Return in about 3 months (around 07/06/2020).  [name], [name]

## 2020-04-16 DIAGNOSIS — E782 Mixed hyperlipidemia: Secondary | ICD-10-CM | POA: Diagnosis not present

## 2020-04-16 DIAGNOSIS — I10 Essential (primary) hypertension: Secondary | ICD-10-CM | POA: Diagnosis not present

## 2020-04-16 DIAGNOSIS — E890 Postprocedural hypothyroidism: Secondary | ICD-10-CM | POA: Diagnosis not present

## 2020-04-16 DIAGNOSIS — E1140 Type 2 diabetes mellitus with diabetic neuropathy, unspecified: Secondary | ICD-10-CM | POA: Diagnosis not present

## 2020-04-16 DIAGNOSIS — E039 Hypothyroidism, unspecified: Secondary | ICD-10-CM | POA: Diagnosis not present

## 2020-05-26 DIAGNOSIS — E1140 Type 2 diabetes mellitus with diabetic neuropathy, unspecified: Secondary | ICD-10-CM | POA: Diagnosis not present

## 2020-05-26 DIAGNOSIS — E782 Mixed hyperlipidemia: Secondary | ICD-10-CM | POA: Diagnosis not present

## 2020-05-26 DIAGNOSIS — E890 Postprocedural hypothyroidism: Secondary | ICD-10-CM | POA: Diagnosis not present

## 2020-05-26 DIAGNOSIS — I10 Essential (primary) hypertension: Secondary | ICD-10-CM | POA: Diagnosis not present

## 2020-05-26 DIAGNOSIS — E039 Hypothyroidism, unspecified: Secondary | ICD-10-CM | POA: Diagnosis not present

## 2020-06-05 DIAGNOSIS — L98499 Non-pressure chronic ulcer of skin of other sites with unspecified severity: Secondary | ICD-10-CM | POA: Diagnosis not present

## 2020-06-15 DIAGNOSIS — I10 Essential (primary) hypertension: Secondary | ICD-10-CM | POA: Diagnosis not present

## 2020-06-15 DIAGNOSIS — E1140 Type 2 diabetes mellitus with diabetic neuropathy, unspecified: Secondary | ICD-10-CM | POA: Diagnosis not present

## 2020-06-15 DIAGNOSIS — E039 Hypothyroidism, unspecified: Secondary | ICD-10-CM | POA: Diagnosis not present

## 2020-06-15 DIAGNOSIS — E890 Postprocedural hypothyroidism: Secondary | ICD-10-CM | POA: Diagnosis not present

## 2020-06-15 DIAGNOSIS — E782 Mixed hyperlipidemia: Secondary | ICD-10-CM | POA: Diagnosis not present

## 2020-06-20 DIAGNOSIS — L97919 Non-pressure chronic ulcer of unspecified part of right lower leg with unspecified severity: Secondary | ICD-10-CM | POA: Diagnosis not present

## 2020-07-04 DIAGNOSIS — L98499 Non-pressure chronic ulcer of skin of other sites with unspecified severity: Secondary | ICD-10-CM | POA: Diagnosis not present

## 2020-07-04 DIAGNOSIS — L97919 Non-pressure chronic ulcer of unspecified part of right lower leg with unspecified severity: Secondary | ICD-10-CM | POA: Diagnosis not present

## 2020-07-11 ENCOUNTER — Ambulatory Visit (INDEPENDENT_AMBULATORY_CARE_PROVIDER_SITE_OTHER): Payer: Medicare HMO | Admitting: Podiatry

## 2020-07-11 ENCOUNTER — Telehealth: Payer: Self-pay | Admitting: Podiatry

## 2020-07-11 ENCOUNTER — Other Ambulatory Visit: Payer: Self-pay

## 2020-07-11 DIAGNOSIS — M2041 Other hammer toe(s) (acquired), right foot: Secondary | ICD-10-CM

## 2020-07-11 DIAGNOSIS — L98499 Non-pressure chronic ulcer of skin of other sites with unspecified severity: Secondary | ICD-10-CM | POA: Insufficient documentation

## 2020-07-11 DIAGNOSIS — B351 Tinea unguium: Secondary | ICD-10-CM | POA: Diagnosis not present

## 2020-07-11 DIAGNOSIS — M79675 Pain in left toe(s): Secondary | ICD-10-CM

## 2020-07-11 DIAGNOSIS — M79674 Pain in right toe(s): Secondary | ICD-10-CM | POA: Diagnosis not present

## 2020-07-11 DIAGNOSIS — M2042 Other hammer toe(s) (acquired), left foot: Secondary | ICD-10-CM

## 2020-07-11 DIAGNOSIS — E1142 Type 2 diabetes mellitus with diabetic polyneuropathy: Secondary | ICD-10-CM

## 2020-07-11 NOTE — Telephone Encounter (Signed)
Spoke to [place] on [employer], she stated she would let Dr. Peri Maris know and they would get in contact with the patient

## 2020-07-13 DIAGNOSIS — R600 Localized edema: Secondary | ICD-10-CM | POA: Diagnosis not present

## 2020-07-13 DIAGNOSIS — L98499 Non-pressure chronic ulcer of skin of other sites with unspecified severity: Secondary | ICD-10-CM | POA: Diagnosis not present

## 2020-07-14 NOTE — Telephone Encounter (Signed)
Thank you :)

## 2020-07-16 ENCOUNTER — Encounter: Payer: Self-pay | Admitting: Podiatry

## 2020-07-16 NOTE — Progress Notes (Signed)
Subjective: Beth Lawson presents today for follow up of preventative diabetic foot care and painful mycotic nails b/l that are difficult to trim. Pain interferes with ambulation. Aggravating factors include wearing enclosed shoe gear. Pain is relieved with periodic professional debridement.   PCP is Peri Maris, FNP. Last visit was 07/04/2020.  Patient states she has dressing on RLE for treatment of ulcer. She states she was told I could remove her dressing as she will no longer be getting wraps on her leg.   She also wears compression hose LLE due to chronic edema.  She does not monitor her blood glucose daily.  No Known Allergies   Objective: There were no vitals filed for this visit.  Pt is a pleasant 80 y.o. year old AA female, morbidly obese,  in NAD. AAO x 3.   Vascular Examination:  Capillary fill time to digits <3 seconds b/l lower extremities. Faintly palpable pedal pulses b/l. Pedal hair present. Pedal hair absent. Lower extremity skin temperature gradient within normal limits. No pain with calf compression b/l. +1 pitting edema b/l lower extremities.  Dermatological Examination: Pedal skin with normal turgor, texture and tone bilaterally. No interdigital macerations bilaterally. Toenails 1-5 b/l elongated, discolored, dystrophic, thickened, crumbly with subungual debris and tenderness to dorsal palpation. Dressing RLE clean, dry, and intact.  Musculoskeletal: Normal muscle strength 5/5 to all lower extremity muscle groups bilaterally. No pain crepitus or joint limitation noted with ROM b/l. Hallux valgus with bunion deformity noted b/l. Hammertoes noted to the 2-5 bilaterally.  Neurological: Protective sensation diminished with 10g monofilament b/l. Vibratory sensation diminished b/l.  Assessment: 1. Pain due to onychomycosis of toenails of both feet   2. Acquired hammertoes of both feet   3. Diabetic peripheral neuropathy associated with type 2 diabetes mellitus (HCC)      Plan: -Continue diabetic foot care principles. -Toenails 1-5 b/l were debrided in length and girth with sterile nail nippers and dremel without iatrogenic bleeding.  -Advised [name] dressing will not be removed and she will have to have dressing removed by PCP as wound appears to be proximal to the ankle. She related understanding.  -Patient to continue soft, supportive shoe gear daily. -Patient to report any pedal injuries to medical professional immediately. -Patient will schedule appointment with Raiford Noble or Fenton Foy to be measured for diabetic shoes at her convenience. -Patient/POA to call should there be question/concern in the interim.  Return in about 3 months (around 10/11/2020).  [name], [name]

## 2020-08-03 DIAGNOSIS — E782 Mixed hyperlipidemia: Secondary | ICD-10-CM | POA: Diagnosis not present

## 2020-08-03 DIAGNOSIS — E039 Hypothyroidism, unspecified: Secondary | ICD-10-CM | POA: Diagnosis not present

## 2020-08-03 DIAGNOSIS — E1140 Type 2 diabetes mellitus with diabetic neuropathy, unspecified: Secondary | ICD-10-CM | POA: Diagnosis not present

## 2020-08-03 DIAGNOSIS — I10 Essential (primary) hypertension: Secondary | ICD-10-CM | POA: Diagnosis not present

## 2020-08-03 DIAGNOSIS — E890 Postprocedural hypothyroidism: Secondary | ICD-10-CM | POA: Diagnosis not present

## 2020-08-22 DIAGNOSIS — H2513 Age-related nuclear cataract, bilateral: Secondary | ICD-10-CM | POA: Diagnosis not present

## 2020-09-13 DIAGNOSIS — E890 Postprocedural hypothyroidism: Secondary | ICD-10-CM | POA: Diagnosis not present

## 2020-09-13 DIAGNOSIS — E1140 Type 2 diabetes mellitus with diabetic neuropathy, unspecified: Secondary | ICD-10-CM | POA: Diagnosis not present

## 2020-09-13 DIAGNOSIS — I10 Essential (primary) hypertension: Secondary | ICD-10-CM | POA: Diagnosis not present

## 2020-09-13 DIAGNOSIS — Z7984 Long term (current) use of oral hypoglycemic drugs: Secondary | ICD-10-CM | POA: Diagnosis not present

## 2020-10-06 DIAGNOSIS — E1140 Type 2 diabetes mellitus with diabetic neuropathy, unspecified: Secondary | ICD-10-CM | POA: Diagnosis not present

## 2020-10-06 DIAGNOSIS — I10 Essential (primary) hypertension: Secondary | ICD-10-CM | POA: Diagnosis not present

## 2020-10-06 DIAGNOSIS — E782 Mixed hyperlipidemia: Secondary | ICD-10-CM | POA: Diagnosis not present

## 2020-10-06 DIAGNOSIS — E039 Hypothyroidism, unspecified: Secondary | ICD-10-CM | POA: Diagnosis not present

## 2020-10-06 DIAGNOSIS — E890 Postprocedural hypothyroidism: Secondary | ICD-10-CM | POA: Diagnosis not present

## 2020-10-10 ENCOUNTER — Ambulatory Visit: Payer: Medicare HMO | Admitting: Podiatry

## 2020-10-10 ENCOUNTER — Other Ambulatory Visit: Payer: Self-pay

## 2020-10-10 ENCOUNTER — Encounter: Payer: Self-pay | Admitting: Podiatry

## 2020-10-10 DIAGNOSIS — M79674 Pain in right toe(s): Secondary | ICD-10-CM

## 2020-10-10 DIAGNOSIS — M79675 Pain in left toe(s): Secondary | ICD-10-CM | POA: Diagnosis not present

## 2020-10-10 DIAGNOSIS — E1142 Type 2 diabetes mellitus with diabetic polyneuropathy: Secondary | ICD-10-CM

## 2020-10-10 DIAGNOSIS — B351 Tinea unguium: Secondary | ICD-10-CM

## 2020-10-11 ENCOUNTER — Other Ambulatory Visit: Payer: Self-pay

## 2020-10-11 ENCOUNTER — Emergency Department (HOSPITAL_COMMUNITY)
Admission: EM | Admit: 2020-10-11 | Discharge: 2020-10-12 | Disposition: A | Discharge status: Home / Self Care | Payer: Medicare HMO | Attending: Emergency Medicine | Admitting: Emergency Medicine

## 2020-10-11 ENCOUNTER — Encounter (HOSPITAL_COMMUNITY): Payer: Self-pay | Admitting: Emergency Medicine

## 2020-10-11 ENCOUNTER — Emergency Department (HOSPITAL_COMMUNITY): Payer: Medicare HMO

## 2020-10-11 DIAGNOSIS — R600 Localized edema: Secondary | ICD-10-CM | POA: Diagnosis not present

## 2020-10-11 DIAGNOSIS — E1142 Type 2 diabetes mellitus with diabetic polyneuropathy: Secondary | ICD-10-CM | POA: Insufficient documentation

## 2020-10-11 DIAGNOSIS — I4891 Unspecified atrial fibrillation: Secondary | ICD-10-CM | POA: Diagnosis not present

## 2020-10-11 DIAGNOSIS — I10 Essential (primary) hypertension: Secondary | ICD-10-CM | POA: Insufficient documentation

## 2020-10-11 DIAGNOSIS — R079 Chest pain, unspecified: Secondary | ICD-10-CM | POA: Diagnosis not present

## 2020-10-11 DIAGNOSIS — Z7984 Long term (current) use of oral hypoglycemic drugs: Secondary | ICD-10-CM | POA: Diagnosis not present

## 2020-10-11 DIAGNOSIS — Z79899 Other long term (current) drug therapy: Secondary | ICD-10-CM | POA: Diagnosis not present

## 2020-10-11 DIAGNOSIS — Z7982 Long term (current) use of aspirin: Secondary | ICD-10-CM | POA: Diagnosis not present

## 2020-10-11 DIAGNOSIS — R0789 Other chest pain: Secondary | ICD-10-CM | POA: Diagnosis not present

## 2020-10-11 DIAGNOSIS — R000 Tachycardia, unspecified: Secondary | ICD-10-CM | POA: Diagnosis not present

## 2020-10-11 DIAGNOSIS — I517 Cardiomegaly: Secondary | ICD-10-CM | POA: Diagnosis not present

## 2020-10-11 DIAGNOSIS — E039 Hypothyroidism, unspecified: Secondary | ICD-10-CM | POA: Insufficient documentation

## 2020-10-11 LAB — CBC WITH DIFFERENTIAL/PLATELET
Abs Immature Granulocytes: 0.01 10*3/uL (ref 0.00–0.07)
Basophils Absolute: 0 10*3/uL (ref 0.0–0.1)
Basophils Relative: 0 %
Eosinophils Absolute: 0.1 10*3/uL (ref 0.0–0.5)
Eosinophils Relative: 2 %
HCT: 34.6 % — ABNORMAL LOW (ref 36.0–46.0)
Hemoglobin: 11.1 g/dL — ABNORMAL LOW (ref 12.0–15.0)
Immature Granulocytes: 0 %
Lymphocytes Relative: 38 %
Lymphs Abs: 2.4 10*3/uL (ref 0.7–4.0)
MCH: 28 pg (ref 26.0–34.0)
MCHC: 32.1 g/dL (ref 30.0–36.0)
MCV: 87.2 fL (ref 80.0–100.0)
Monocytes Absolute: 0.6 10*3/uL (ref 0.1–1.0)
Monocytes Relative: 9 %
Neutro Abs: 3.3 10*3/uL (ref 1.7–7.7)
Neutrophils Relative %: 51 %
Platelets: 258 10*3/uL (ref 150–400)
RBC: 3.97 MIL/uL (ref 3.87–5.11)
RDW: 14.8 % (ref 11.5–15.5)
WBC: 6.4 10*3/uL (ref 4.0–10.5)
nRBC: 0 % (ref 0.0–0.2)

## 2020-10-11 MED ORDER — ASPIRIN 81 MG PO CHEW
324.0000 mg | CHEWABLE_TABLET | Freq: Once | ORAL | Status: AC
  Administered 2020-10-12: 324 mg via ORAL

## 2020-10-11 NOTE — ED Triage Notes (Signed)
Pt BIB [facility] for new onset afib with chest discomfort to L side of chest, non-radiating. Afib rate 110-130. Pt given 500 cc NS.

## 2020-10-11 NOTE — ED Provider Notes (Signed)
[facility] EMERGENCY DEPARTMENT Provider Note   CSN: [id number] Arrival date & time: 10/11/20  2245     History Chief Complaint  Patient presents with   Chest Pain    Beth Lawson is a 80 y.o. female with a hx of T2DM, hypertension, hyperlipidemia, seizures & hypothyroidism who presents to the ED via EMS with complaints of chest discomfort that began shortly prior to arrival, but is resolved @ present. Patient states she was at rest when she began to feel overheated with left sided chest pressure, lasted < 15 minutes, no alleviating/aggravating factors, but called EMS to get checked out. No pain at present. No associated dizziness, dyspnea, or syncope with sxs. Currently feels okay. Per EMS patient in afib on arrival, gave 500 cc NS. Patient denies hx of afib. Denies fever, chills, N/V/D, or abdominal pain.   HPI     Past Medical History:  Diagnosis Date   Diabetes mellitus, type 2 (HCC)    HTN (hypertension)    Hyperlipidemia    Hypothyroidism    Seizures (HCC)     Patient Active Problem List   Diagnosis Date Noted   Skin ulcer (HCC) 07/11/2020   Bilateral lower extremity edema 04/14/2020   Body mass index (BMI) 40.0-44.9, adult (HCC) 04/14/2020   Cardiac murmur, unspecified 04/14/2020   Diabetic peripheral neuropathy associated with type 2 diabetes mellitus (HCC) 04/14/2020   Essential hypertension 04/14/2020   Hypothyroid 04/14/2020   Mixed hyperlipidemia 04/14/2020   Morbid obesity (HCC) 04/14/2020   Postoperative hypothyroidism 04/14/2020   Seizure disorder (HCC) 04/14/2020   Urinary incontinence 04/14/2020    Past Surgical History:  Procedure Laterality Date   ABDOMINAL HYSTERECTOMY     BREAST SURGERY     THYROIDECTOMY  1988     OB History   No obstetric history on file.     No family history on file.  Social History   Tobacco Use   Smoking status: Never   Smokeless tobacco: Never    Home Medications Prior to Admission  medications   Medication Sig Start Date End Date Taking? Authorizing Provider  amLODipine (NORVASC) 10 MG tablet Take 1 tablet by mouth daily.    [provider]  amoxicillin-clavulanate (AUGMENTIN) 875-125 MG tablet  11/17/18   [provider]  aspirin 81 MG EC tablet 1 tablet    [provider]  atorvastatin (LIPITOR) 80 MG tablet Take 1 tablet by mouth daily.    [provider]  azelastine (OPTIVAR) 0.05 % ophthalmic solution 1 drop into bilateral eyes    [provider]  clotrimazole-betamethasone (LOTRISONE) cream Apply to scaly skin on both feet and ankles twice daily for 4 weeks 02/19/18   [name], [name]  doxycycline (VIBRAMYCIN) 100 MG capsule 1 capsule 06/05/20   [provider]  furosemide (LASIX) 40 MG tablet Take 1 tablet by mouth daily.    [provider]  levETIRAcetam (KEPPRA) 500 MG tablet Take 1 tablet by mouth 2 (two) times daily.    [provider]  levothyroxine (SYNTHROID) 175 MCG tablet 1 tablet in the morning on an empty stomach 05/26/20   [provider]  losartan-hydrochlorothiazide (HYZAAR) 50-12.5 MG tablet Take 1 tablet by mouth daily.    [provider]  metFORMIN (GLUCOPHAGE) 500 MG tablet Take 1 tablet by mouth 2 (two) times daily with a meal.    [provider]  metoprolol succinate (TOPROL-XL) 100 MG 24 hr tablet Take 1 tablet (100 mg total) by  mouth daily. Take with or immediately following a meal. 08/21/17   Deeann Saint, MD  Multiple Vitamins-Minerals (CENTRUM SILVER 50+WOMEN) TABS See admin instructions.    [provider]  nitrofurantoin, macrocrystal-monohydrate, (MACROBID) 100 MG capsule  02/26/18   [provider]  [facility] injection  05/24/19   [provider]    Allergies    Patient has no known allergies.  Review of Systems   Review of Systems  Constitutional:  Negative for chills and fever.  Respiratory:  Negative  for shortness of breath.   Cardiovascular:  Positive for chest pain (resolved). Negative for palpitations.  Gastrointestinal:  Negative for abdominal pain, diarrhea, nausea and vomiting.  Neurological:  Negative for dizziness, syncope and light-headedness.  All other systems reviewed and are negative.  Physical Exam Updated Vital Signs Ht 5\' 8"  (1.727 m)   Wt 117 kg   BMI 39.23 kg/m   Physical Exam Vitals and nursing note reviewed.  Constitutional:      General: She is not in acute distress.    Appearance: She is well-developed. She is not toxic-appearing.  HENT:     Head: Normocephalic and atraumatic.  Eyes:     General:        Right eye: No discharge.        Left eye: No discharge.     Conjunctiva/sclera: Conjunctivae normal.  Cardiovascular:     Comments: Irregularly irregular Pulmonary:     Effort: Pulmonary effort is normal. No respiratory distress.     Breath sounds: Normal breath sounds. No wheezing, rhonchi or rales.  Abdominal:     General: There is no distension.     Palpations: Abdomen is soft.     Tenderness: There is no abdominal tenderness.  Musculoskeletal:     Cervical back: Neck supple.     Right lower leg: Edema present.     Left lower leg: Edema present.     Comments: Symmetric edema to BLE. Chronic appearing skin changes. No calf tenderness to palpation.   Skin:    General: Skin is warm and dry.     Findings: No rash.  Neurological:     Mental Status: She is alert.     Comments: Clear speech.   Psychiatric:        Behavior: Behavior normal.    ED Results / Procedures / Treatments   Labs (all labs ordered are listed, but only abnormal results are displayed) Labs Reviewed - No data to display  EKG None  Radiology DG Chest Portable 1 View  Result Date: 10/11/2020 CLINICAL DATA:  Chest pain EXAM: PORTABLE CHEST 1 VIEW COMPARISON:  None. FINDINGS: No focal opacity or pleural effusion. Borderline to mild cardiomegaly. No pneumothorax.  IMPRESSION: No active disease.  Borderline to mild cardiomegaly Electronically Signed   By: 10/13/2020 M.D.   On: 10/11/2020 23:24    Procedures Procedures   Medications Ordered in ED Medications - No data to display  ED Course  I have reviewed the triage vital signs and the nursing notes.  Pertinent labs & imaging results that were available during my care of the patient were reviewed by me and considered in my medical decision making (see chart for details).    MDM Rules/Calculators/A&P                          Patient presents to the ED with complaints of chest discomfort that is resolved at present. In afib with  rate ranging 90s-130s. Nontoxic, does not appear in acute distress. No hx of prior afib.   EKG: Afib, some findings concerning for ischemia, no STEMI.  Aspirin ordered.   CHA2DS2-VASc: 5  Additional history obtained:  Additional history obtained from chart review & nursing note review.   Lab Tests:  I Ordered, reviewed, and interpreted labs, which included:  CBC: mild anemia.  CMP: mildly elevated creatinine. No significant electrolyte derangement.  Troponin: No significant elevation, flat TSH: WNL  Imaging Studies ordered:  I ordered imaging studies which included CXR, I independently reviewed, formal radiology impression shows: : No active disease.  Borderline to mild cardiomegaly  ED Course:  Patient presented with an episode of chest discomfort that lasted approximately 15 minutes, resolved, no pain at present.  She was found to be in A. fib with rates ranging 90s to 130s on arrival.  No history of A. fib.  Obtain basic labs, no significant electrolyte derangement, thyroid dysfunction, or critical anemia as underlying etiology.  Chest x-ray without infiltrate or pulmonary edema.  EKG with findings concerning somewhat for ischemia, heart score is 6, will discuss with cardiology.  02:34: CONSULT: Discussed with Dr. Hyacinth Meeker with cardiology who has reviewed  EKG, we discussed patient presentation, work-up, and disposition, at this time he recommends discharge home on anticoagulation with outpatient follow-up.  Appreciate input.  Patient ambulated in the emergency department maintaining SPO2 greater than 95% on room air, heart rate remained below 100 with ambulation.  She did not have any chest pain or other symptoms with ambulation.  On reassessment she states that she is ready to go home.  Patient converted to normal sinus rhythm while in the emergency department.  Given discussion with cardiology, and patient wishing to go home, shared decision making, will discharge on anticoagulation, stop her aspirin.  She has not had prior issues with internal bleeding, no melena/hematochezia, and denies issues with falling at home.  Pharmacy was consulted and provided education to the patient.  Ambulatory referral placed to A. fib clinic, prescription for Eliquis provided, and will discharge. I discussed results, treatment plan, need for close follow-up, and strict return precautions with the patient. Provided opportunity for questions, patient confirmed understanding and is in agreement with plan.   Findings and plan of care discussed with supervising physician Dr. Rush Landmark who is in agreement.   Portions of this note were generated with Scientist, clinical (histocompatibility and immunogenetics). Dictation errors may occur despite best attempts at proofreading.  Final Clinical Impression(s) / ED Diagnoses Final diagnoses:  Atrial fibrillation, unspecified type [facility])    Rx / DC Orders ED Discharge Orders          Ordered    Amb Referral to AFIB Clinic        10/12/20 0438    apixaban (ELIQUIS) 5 MG TABS tablet  2 times daily        10/12/20 0438             Bland Rudzinski, Pleas Koch, PA-C 10/12/20 0453    Tegeler, Canary Brim, MD 10/12/20 [phone]

## 2020-10-12 DIAGNOSIS — I4891 Unspecified atrial fibrillation: Secondary | ICD-10-CM | POA: Diagnosis not present

## 2020-10-12 LAB — COMPREHENSIVE METABOLIC PANEL
ALT: 20 U/L (ref 0–44)
AST: 33 U/L (ref 15–41)
Albumin: 3.6 g/dL (ref 3.5–5.0)
Alkaline Phosphatase: 66 U/L (ref 38–126)
Anion gap: 9 (ref 5–15)
BUN: 24 mg/dL — ABNORMAL HIGH (ref 8–23)
CO2: 26 mmol/L (ref 22–32)
Calcium: 8.9 mg/dL (ref 8.9–10.3)
Chloride: 106 mmol/L (ref 98–111)
Creatinine, Ser: 1.07 mg/dL — ABNORMAL HIGH (ref 0.44–1.00)
GFR, Estimated: 53 mL/min — ABNORMAL LOW (ref >60)
Glucose, Bld: 138 mg/dL — ABNORMAL HIGH (ref 70–99)
Potassium: 3.6 mmol/L (ref 3.5–5.1)
Sodium: 141 mmol/L (ref 135–145)
Total Bilirubin: 1 mg/dL (ref 0.3–1.2)
Total Protein: 6.7 g/dL (ref 6.5–8.1)

## 2020-10-12 LAB — TROPONIN I (HIGH SENSITIVITY)
Troponin I (High Sensitivity): 13 ng/L (ref <18)
Troponin I (High Sensitivity): 15 ng/L (ref <18)

## 2020-10-12 LAB — TSH: TSH: 2.197 u[IU]/mL (ref 0.350–4.500)

## 2020-10-12 MED ORDER — APIXABAN 5 MG PO TABS: 5.0000 mg | ORAL_TABLET | Freq: Two times a day (BID) | ORAL | 0 refills | Status: DC

## 2020-10-12 MED ORDER — APIXABAN 5 MG PO TABS
5.0000 mg | ORAL_TABLET | Freq: Two times a day (BID) | ORAL | Status: DC
  Administered 2020-10-12: 5 mg via ORAL
  Filled 2020-10-12: qty 1

## 2020-10-12 NOTE — Discharge Instructions (Addendum)
You were seen in the emergency department tonight for chest discomfort.  Your cardiac monitoring showed that you have been in atrial fibrillation, for this reason we are starting you on Eliquis.  Eliquis is a blood thinner.  Please take this twice per day.  You were given your first dose here in the emergency department.  Eliquis being a blood thinner means that you are at high risk for bleeding, come back to the emergency department immediately if you have dark/tarry stool, you are coughing up blood, you have blood in your urine, you hit your head, you get into any accident. STOP taking your aspirin while taking Eliquis.  We have prescribed you new medication(s) today. Discuss the medications prescribed today with your pharmacist as they can have adverse effects and interactions with your other medicines including over the counter and prescribed medications. Seek medical evaluation if you start to experience new or abnormal symptoms after taking one of these medicines, seek care immediately if you start to experience difficulty breathing, feeling of your throat closing, facial swelling, or rash as these could be indications of a more serious allergic reaction  We have sent a referral to the A. fib clinic to set you up for follow-up.  Please be sure to follow-up with the A. fib clinic or with cardiology within 3 days.  Your hemoglobin was a little bit low and your creatinine was a little bit high, please have these rechecked by your primary care provider.  Return to the emergency department immediately for new or worsening symptoms including but not limited to chest pain, trouble breathing, dizziness, feeling like your heart is racing, passing out, inability to keep fluids down, fever, or any other concerns.

## 2020-10-12 NOTE — Progress Notes (Signed)
ANTICOAGULATION CONSULT NOTE - Initial Consult  Pharmacy Consult for Apixaban Indication: atrial fibrillation  No Known Allergies  Patient Measurements: Height: 5\' 8"  (172.7 cm) Weight: 117 kg (258 lb) IBW/kg (Calculated) : 63.9  Vital Signs: Temp: 98.1 F (36.7 C) (06/15 2254) Temp Source: Oral (06/15 2254) BP: 140/78 (06/16 0345) Pulse Rate: 70 (06/16 0345)  Labs: Recent Labs    10/11/20 2255 10/12/20 0104  HGB 11.1*  --   HCT 34.6*  --   PLT 258  --   CREATININE 1.07*  --   TROPONINIHS 13 15    Estimated Creatinine Clearance: 56.3 mL/min (A) (by C-G formula based on SCr of 1.07 mg/dL (H)).   Medical History: Past Medical History:  Diagnosis Date   Diabetes mellitus, type 2 (HCC)    HTN (hypertension)    Hyperlipidemia    Hypothyroidism    Seizures (HCC)       Assessment: 80 y/o F with new onset atrial fibrillation. To begin anti-coagulation with Apixaban. Hgb 11.1. Renal function ok. Pt has been educated.  Goal of Therapy:  Monitor platelets by anticoagulation protocol: Yes   Plan:  Apixaban 5 mg BID Patient education provided  96, PharmD, BCPS Clinical Pharmacist Phone: [phone]

## 2020-10-12 NOTE — ED Notes (Signed)
Pt ox stayed at 97 while ambulated

## 2020-10-16 NOTE — Progress Notes (Signed)
  Subjective:  Patient ID: Beth Lawson, female    DOB: 1940-07-21,  MRN: [id number]  80 y.o. female presents with at risk foot care with history of diabetic neuropathy and thick, elongated toenails b/l feet which are tender when wearing enclosed shoe gear..    Patient did not check blood glucose this morning.  PCP: [name], FNP and last visit was: 09/13/2020.  She notes no new pedal problems on today's visit.  Review of Systems: Negative except as noted in the HPI.   No Known Allergies  Objective:  There were no vitals filed for this visit. Constitutional Patient is a pleasant 80 y.o. African American female morbidly obese in NAD. AAO x 3.  Vascular Capillary fill time to digits <3 seconds b/l lower extremities. Faintly palpable pedal pulses b/l. Pedal hair absent. Lower extremity skin temperature gradient within normal limits. No pain with calf compression b/l. +1 pitting edema b/l lower extremities. No cyanosis or clubbing noted.  Neurologic Normal speech. Protective sensation diminished with 10g monofilament b/l.  Dermatologic Pedal skin with normal turgor, texture and tone bilaterally. No open wounds bilaterally. No interdigital macerations bilaterally. Toenails 1-5 b/l elongated, discolored, dystrophic, thickened, crumbly with subungual debris and tenderness to dorsal palpation.  Orthopedic: Normal muscle strength 5/5 to all lower extremity muscle groups bilaterally. Hallux valgus with bunion deformity noted b/l lower extremities. Hammertoe(s) noted to the 2-5 bilaterally.    Assessment:   1. Pain due to onychomycosis of toenails of both feet   2. Diabetic peripheral neuropathy associated with type 2 diabetes mellitus (HCC)    Plan:  Patient was evaluated and treated and all questions answered.  Onychomycosis with pain -Nails palliatively debridement as below. -Educated on self-care  Procedure: Nail Debridement Rationale: Pain Type of Debridement: manual, sharp  debridement. Instrumentation: Nail nipper, rotary burr. Number of Nails: 10  -Examined patient. -Continue diabetic foot care principles. -Patient to continue soft, supportive shoe gear daily. -Toenails 1-5 b/l were debrided in length and girth with sterile nail nippers and dremel without iatrogenic bleeding.  -Patient to report any pedal injuries to medical professional immediately. -Patient/POA to call should there be question/concern in the interim.  Return in about 3 months (around 01/10/2021).  [name], [name]

## 2020-10-18 ENCOUNTER — Ambulatory Visit (HOSPITAL_COMMUNITY)
Admission: RE | Admit: 2020-10-18 | Discharge: 2020-10-18 | Disposition: A | Discharge status: Home / Self Care | Payer: Medicare HMO | Source: Ambulatory Visit | Attending: Nurse Practitioner | Admitting: Nurse Practitioner

## 2020-10-18 ENCOUNTER — Encounter (HOSPITAL_COMMUNITY): Payer: Self-pay | Admitting: Nurse Practitioner

## 2020-10-18 ENCOUNTER — Other Ambulatory Visit: Payer: Self-pay

## 2020-10-18 VITALS — BP 162/80 | HR 74 | Ht 68.0 in | Wt 254.0 lb

## 2020-10-18 DIAGNOSIS — D6869 Other thrombophilia: Secondary | ICD-10-CM

## 2020-10-18 DIAGNOSIS — E039 Hypothyroidism, unspecified: Secondary | ICD-10-CM | POA: Insufficient documentation

## 2020-10-18 DIAGNOSIS — I480 Paroxysmal atrial fibrillation: Secondary | ICD-10-CM | POA: Insufficient documentation

## 2020-10-18 DIAGNOSIS — Z7901 Long term (current) use of anticoagulants: Secondary | ICD-10-CM | POA: Insufficient documentation

## 2020-10-18 DIAGNOSIS — Z79899 Other long term (current) drug therapy: Secondary | ICD-10-CM | POA: Insufficient documentation

## 2020-10-18 DIAGNOSIS — E785 Hyperlipidemia, unspecified: Secondary | ICD-10-CM | POA: Insufficient documentation

## 2020-10-18 DIAGNOSIS — E119 Type 2 diabetes mellitus without complications: Secondary | ICD-10-CM | POA: Insufficient documentation

## 2020-10-18 DIAGNOSIS — Z7984 Long term (current) use of oral hypoglycemic drugs: Secondary | ICD-10-CM | POA: Diagnosis not present

## 2020-10-18 DIAGNOSIS — R569 Unspecified convulsions: Secondary | ICD-10-CM | POA: Diagnosis not present

## 2020-10-18 DIAGNOSIS — I10 Essential (primary) hypertension: Secondary | ICD-10-CM | POA: Insufficient documentation

## 2020-10-18 MED ORDER — APIXABAN 5 MG PO TABS: 5.0000 mg | ORAL_TABLET | Freq: Two times a day (BID) | ORAL | 2 refills | Status: DC

## 2020-10-18 NOTE — Progress Notes (Signed)
Primary Care Physician: [name], FNP Referring Physician: ER MD   Beth Lawson is a 80 y.o. female with a h/o of T2DM, hypertension, hyperlipidemia, seizures & hypothyroidism who presented to the ED 10/11/20 via EMS with complaints of chest discomfort that began shortly prior to arrival, but shortly resolved. Pt felt she got overheated and that brought on the afib. This is a new diagnosis. She was continued on her metoprolol and started on eliquis 5 mg bid for a CHA2DS2VASc score of at least 4. She is in SR today and has not noted any further afib.   Today, she denies symptoms of palpitations, chest pain, shortness of breath, orthopnea, PND, lower extremity edema, dizziness, presyncope, syncope, or neurologic sequela. The patient is tolerating medications without difficulties and is otherwise without complaint today.   Past Medical History:  Diagnosis Date   Diabetes mellitus, type 2 (HCC)    HTN (hypertension)    Hyperlipidemia    Hypothyroidism    Seizures (HCC)    Past Surgical History:  Procedure Laterality Date   ABDOMINAL HYSTERECTOMY     BREAST SURGERY     THYROIDECTOMY  1988    Current Outpatient Medications  Medication Sig Dispense Refill   amLODipine (NORVASC) 10 MG tablet Take 10 mg by mouth daily.     atorvastatin (LIPITOR) 80 MG tablet Take 80 mg by mouth daily.     azelastine (OPTIVAR) 0.05 % ophthalmic solution Place 1 drop into both eyes daily.     furosemide (LASIX) 40 MG tablet Take 40 mg by mouth daily.     levETIRAcetam (KEPPRA) 500 MG tablet Take 500 mg by mouth 2 (two) times daily.     levothyroxine (SYNTHROID) 175 MCG tablet Take 175 mcg by mouth daily before breakfast.     losartan-hydrochlorothiazide (HYZAAR) 50-12.5 MG tablet Take 1 tablet by mouth daily.     metFORMIN (GLUCOPHAGE) 500 MG tablet Take 1 tablet by mouth 2 (two) times daily with a meal.     metoprolol succinate (TOPROL-XL) 100 MG 24 hr tablet Take 1 tablet (100 mg total) by mouth  daily. Take with or immediately following a meal. 90 tablet 2   Multiple Vitamins-Minerals (CENTRUM SILVER 50+WOMEN) TABS Take 1 tablet by mouth daily.     apixaban (ELIQUIS) 5 MG TABS tablet Take 1 tablet (5 mg total) by mouth 2 (two) times daily. 180 tablet 2   No current facility-administered medications for this encounter.    No Known Allergies  Social History   Socioeconomic History   Marital status: Married    Spouse name: Not on file   Number of children: Not on file   Years of education: Not on file   Highest education level: Not on file  Occupational History   Not on file  Tobacco Use   Smoking status: Never   Smokeless tobacco: Never  Substance and Sexual Activity   Alcohol use: Not on file   Drug use: Not on file   Sexual activity: Not on file  Other Topics Concern   Not on file  Social History Narrative   Not on file   Social Determinants of Health   Financial Resource Strain: Not on file  Food Insecurity: Not on file  Transportation Needs: Not on file  Physical Activity: Not on file  Stress: Not on file  Social Connections: Not on file  Intimate Partner Violence: Not on file    No family history on file.  ROS- All systems are reviewed  and negative except as per the HPI above  Physical Exam: Vitals:   10/18/20 1512  BP: (!) 162/80  Pulse: 74  Weight: 115.2 kg  Height: 5\' 8"  (1.727 m)   Wt Readings from Last 3 Encounters:  10/18/20 115.2 kg  10/11/20 117 kg  08/21/17 130.6 kg    Labs: Lab Results  Component Value Date   NA 141 10/11/2020   K 3.6 10/11/2020   CL 106 10/11/2020   CO2 26 10/11/2020   GLUCOSE 138 (H) 10/11/2020   BUN 24 (H) 10/11/2020   CREATININE 1.07 (H) 10/11/2020   CALCIUM 8.9 10/11/2020   No results found for: INR No results found for: CHOL, HDL, LDLCALC, TRIG   GEN- The patient is well appearing, alert and oriented x 3 today.   Head- normocephalic, atraumatic Eyes-  Sclera clear, conjunctiva pink Ears-  hearing intact Oropharynx- clear Neck- supple, no JVP Lymph- no cervical lymphadenopathy Lungs- Clear to ausculation bilaterally, normal work of breathing Heart- Regular rate and rhythm, no murmurs, rubs or gallops, PMI not laterally displaced GI- soft, NT, ND, + BS Extremities- no clubbing, cyanosis, or edema MS- no significant deformity or atrophy Skin- no rash or lesion Psych- euthymic mood, full affect Neuro- strength and sensation are intact  EKG- NSR at 74 bpm, pr int 190 ms, qrs int 86 ms, qtc 448 ms  No prior echo to review   Assessment and Plan:  1. New onset afib  General eduction re afib  Triggers discussed but none identified  Continue metoprolol succinate 100 mg qd Echo ordered   2. CHA2DS2VASc score of 4 Bleeding precautions discussed  Continue eliquis 5 mg bid   I will see back in 4-6 weeks after echo   10/13/2020 C. Lupita Leash [facility] [address] [place], [place] [id number]

## 2020-12-07 ENCOUNTER — Other Ambulatory Visit (HOSPITAL_COMMUNITY): Payer: Self-pay | Admitting: Nurse Practitioner

## 2020-12-07 ENCOUNTER — Ambulatory Visit (HOSPITAL_COMMUNITY)
Admission: RE | Admit: 2020-12-07 | Discharge: 2020-12-07 | Disposition: A | Discharge status: Home / Self Care | Payer: Medicare HMO | Source: Ambulatory Visit | Attending: Nurse Practitioner | Admitting: Nurse Practitioner

## 2020-12-07 ENCOUNTER — Encounter (HOSPITAL_COMMUNITY): Payer: Self-pay | Admitting: Nurse Practitioner

## 2020-12-07 ENCOUNTER — Other Ambulatory Visit: Payer: Self-pay

## 2020-12-07 ENCOUNTER — Ambulatory Visit (HOSPITAL_BASED_OUTPATIENT_CLINIC_OR_DEPARTMENT_OTHER)
Admission: RE | Admit: 2020-12-07 | Discharge: 2020-12-07 | Disposition: A | Discharge status: Home / Self Care | Payer: Medicare HMO | Source: Ambulatory Visit | Attending: Nurse Practitioner | Admitting: Nurse Practitioner

## 2020-12-07 VITALS — BP 118/68 | HR 64 | Ht 68.0 in | Wt 253.6 lb

## 2020-12-07 DIAGNOSIS — E119 Type 2 diabetes mellitus without complications: Secondary | ICD-10-CM | POA: Insufficient documentation

## 2020-12-07 DIAGNOSIS — E785 Hyperlipidemia, unspecified: Secondary | ICD-10-CM | POA: Insufficient documentation

## 2020-12-07 DIAGNOSIS — Z7901 Long term (current) use of anticoagulants: Secondary | ICD-10-CM | POA: Diagnosis not present

## 2020-12-07 DIAGNOSIS — R569 Unspecified convulsions: Secondary | ICD-10-CM | POA: Insufficient documentation

## 2020-12-07 DIAGNOSIS — Z79899 Other long term (current) drug therapy: Secondary | ICD-10-CM | POA: Insufficient documentation

## 2020-12-07 DIAGNOSIS — Z7984 Long term (current) use of oral hypoglycemic drugs: Secondary | ICD-10-CM | POA: Diagnosis not present

## 2020-12-07 DIAGNOSIS — I313 Pericardial effusion (noninflammatory): Secondary | ICD-10-CM | POA: Diagnosis not present

## 2020-12-07 DIAGNOSIS — I480 Paroxysmal atrial fibrillation: Secondary | ICD-10-CM

## 2020-12-07 DIAGNOSIS — D6869 Other thrombophilia: Secondary | ICD-10-CM | POA: Diagnosis not present

## 2020-12-07 DIAGNOSIS — I4891 Unspecified atrial fibrillation: Secondary | ICD-10-CM | POA: Insufficient documentation

## 2020-12-07 DIAGNOSIS — E039 Hypothyroidism, unspecified: Secondary | ICD-10-CM | POA: Diagnosis not present

## 2020-12-07 DIAGNOSIS — I10 Essential (primary) hypertension: Secondary | ICD-10-CM | POA: Diagnosis not present

## 2020-12-07 DIAGNOSIS — I082 Rheumatic disorders of both aortic and tricuspid valves: Secondary | ICD-10-CM | POA: Diagnosis not present

## 2020-12-07 LAB — BASIC METABOLIC PANEL
Anion gap: 8 (ref 5–15)
BUN: 24 mg/dL — ABNORMAL HIGH (ref 8–23)
CO2: 29 mmol/L (ref 22–32)
Calcium: 8.9 mg/dL (ref 8.9–10.3)
Chloride: 104 mmol/L (ref 98–111)
Creatinine, Ser: 1.07 mg/dL — ABNORMAL HIGH (ref 0.44–1.00)
GFR, Estimated: 53 mL/min — ABNORMAL LOW (ref >60)
Glucose, Bld: 115 mg/dL — ABNORMAL HIGH (ref 70–99)
Potassium: 3.1 mmol/L — ABNORMAL LOW (ref 3.5–5.1)
Sodium: 141 mmol/L (ref 135–145)

## 2020-12-07 LAB — CBC
HCT: 35.2 % — ABNORMAL LOW (ref 36.0–46.0)
Hemoglobin: 11.4 g/dL — ABNORMAL LOW (ref 12.0–15.0)
MCH: 27.7 pg (ref 26.0–34.0)
MCHC: 32.4 g/dL (ref 30.0–36.0)
MCV: 85.6 fL (ref 80.0–100.0)
Platelets: 255 10*3/uL (ref 150–400)
RBC: 4.11 MIL/uL (ref 3.87–5.11)
RDW: 14.8 % (ref 11.5–15.5)
WBC: 5.7 10*3/uL (ref 4.0–10.5)
nRBC: 0 % (ref 0.0–0.2)

## 2020-12-07 LAB — ECHOCARDIOGRAM COMPLETE
Area-P 1/2: 5.31 cm2
S' Lateral: 1.8 cm

## 2020-12-07 MED ORDER — PERFLUTREN LIPID MICROSPHERE
1.0000 mL | INTRAVENOUS | Status: DC | PRN
  Administered 2020-12-07: 3 mL via INTRAVENOUS
  Filled 2020-12-07: qty 10

## 2020-12-07 NOTE — Progress Notes (Signed)
  Echocardiogram 2D Echocardiogram has been performed.  Gerda Diss 12/07/2020, 10:10 AM

## 2020-12-07 NOTE — Progress Notes (Signed)
Primary Care Physician: [name], FNP Referring Physician: ER MD   Beth Lawson is a 80 y.o. female with a h/o of T2DM, hypertension, hyperlipidemia, seizures & hypothyroidism who presented to the ED 10/11/20 via EMS with complaints of chest discomfort that began shortly prior to arrival, but shortly resolved. Pt felt she got overheated and that brought on the afib. This is a new diagnosis. She was continued on her metoprolol and started on eliquis 5 mg bid for a CHA2DS2VASc score of at least 4. She is in SR today and has not noted any further afib.   F/u in afib clinic, 12/07/20. Echo done this am. Has been on anticoagulation for new onset afib noted in ER in June. Has done well with out any bleeding issues. Ekg shows SR and she has not noted any irregular HR.   Today, she denies symptoms of palpitations, chest pain, shortness of breath, orthopnea, PND, lower extremity edema, dizziness, presyncope, syncope, or neurologic sequela. The patient is tolerating medications without difficulties and is otherwise without complaint today.   Past Medical History:  Diagnosis Date   Diabetes mellitus, type 2 (HCC)    HTN (hypertension)    Hyperlipidemia    Hypothyroidism    Seizures (HCC)    Past Surgical History:  Procedure Laterality Date   ABDOMINAL HYSTERECTOMY     BREAST SURGERY     THYROIDECTOMY  1988    Current Outpatient Medications  Medication Sig Dispense Refill   amLODipine (NORVASC) 10 MG tablet Take 10 mg by mouth daily.     apixaban (ELIQUIS) 5 MG TABS tablet Take 1 tablet (5 mg total) by mouth 2 (two) times daily. 180 tablet 2   atorvastatin (LIPITOR) 80 MG tablet Take 80 mg by mouth daily.     azelastine (OPTIVAR) 0.05 % ophthalmic solution Place 1 drop into both eyes daily.     furosemide (LASIX) 40 MG tablet Take 40 mg by mouth daily.     levETIRAcetam (KEPPRA) 500 MG tablet Take 500 mg by mouth 2 (two) times daily.     levothyroxine (SYNTHROID) 175 MCG tablet Take  175 mcg by mouth daily before breakfast.     losartan-hydrochlorothiazide (HYZAAR) 50-12.5 MG tablet Take 1 tablet by mouth daily.     metFORMIN (GLUCOPHAGE) 500 MG tablet Take 1 tablet by mouth 2 (two) times daily with a meal.     metoprolol succinate (TOPROL-XL) 100 MG 24 hr tablet Take 1 tablet (100 mg total) by mouth daily. Take with or immediately following a meal. 90 tablet 2   Multiple Vitamins-Minerals (CENTRUM SILVER 50+WOMEN) TABS Take 1 tablet by mouth daily.     No current facility-administered medications for this encounter.    No Known Allergies  Social History   Socioeconomic History   Marital status: Married    Spouse name: Not on file   Number of children: Not on file   Years of education: Not on file   Highest education level: Not on file  Occupational History   Not on file  Tobacco Use   Smoking status: Never   Smokeless tobacco: Never  Substance and Sexual Activity   Alcohol use: Not on file   Drug use: Not on file   Sexual activity: Not on file  Other Topics Concern   Not on file  Social History Narrative   Not on file   Social Determinants of Health   Financial Resource Strain: Not on file  Food Insecurity: Not on file  Transportation Needs: Not on file  Physical Activity: Not on file  Stress: Not on file  Social Connections: Not on file  Intimate Partner Violence: Not on file    No family history on file.  ROS- All systems are reviewed and negative except as per the HPI above  Physical Exam: Vitals:   12/07/20 0829  Height: 5\' 8"  (1.727 m)   Wt Readings from Last 3 Encounters:  10/18/20 115.2 kg  10/11/20 117 kg  08/21/17 130.6 kg    Labs: Lab Results  Component Value Date   NA 141 10/11/2020   K 3.6 10/11/2020   CL 106 10/11/2020   CO2 26 10/11/2020   GLUCOSE 138 (H) 10/11/2020   BUN 24 (H) 10/11/2020   CREATININE 1.07 (H) 10/11/2020   CALCIUM 8.9 10/11/2020   No results found for: INR No results found for: CHOL, HDL,  LDLCALC, TRIG   GEN- The patient is well appearing, alert and oriented x 3 today.   Head- normocephalic, atraumatic Eyes-  Sclera clear, conjunctiva pink Ears- hearing intact Oropharynx- clear Neck- supple, no JVP Lymph- no cervical lymphadenopathy Lungs- Clear to ausculation bilaterally, normal work of breathing Heart- Regular rate and rhythm, 2/6 systolic  murmurs, rubs or gallops, PMI not laterally displaced GI- soft, NT, ND, + BS Extremities- no clubbing, cyanosis, or edema MS- no significant deformity or atrophy Skin- no rash or lesion Psych- euthymic mood, full affect Neuro- strength and sensation are intact  EKG- NSR at 64 bpm, pr int 206 ms, qrs int 98 ms, qtc 433 ms  Echo done this am and results are pending    Assessment and Plan:  1. New onset afib 10/11/20 with ER visit  General eduction re afib  Pt not aware of any further afib  In SR today  Continue metoprolol succinate 100 mg qd  2. CHA2DS2VASc score of 4 Bleeding precautions discussed  No bleeding noted  Continue eliquis 5 mg bid  Cbc/bmet drawn    10/13/20 C. Lupita Leash [facility] [address] [place], [place] [phone]

## 2020-12-08 ENCOUNTER — Other Ambulatory Visit (HOSPITAL_COMMUNITY): Payer: Self-pay | Admitting: *Deleted

## 2020-12-08 DIAGNOSIS — I480 Paroxysmal atrial fibrillation: Secondary | ICD-10-CM

## 2020-12-08 MED ORDER — POTASSIUM CHLORIDE ER 10 MEQ PO TBCR: 20.0000 meq | EXTENDED_RELEASE_TABLET | Freq: Every day | ORAL | 3 refills | Status: DC

## 2020-12-15 ENCOUNTER — Other Ambulatory Visit (HOSPITAL_COMMUNITY): Payer: Self-pay

## 2020-12-15 DIAGNOSIS — E1140 Type 2 diabetes mellitus with diabetic neuropathy, unspecified: Secondary | ICD-10-CM | POA: Diagnosis not present

## 2020-12-15 DIAGNOSIS — E782 Mixed hyperlipidemia: Secondary | ICD-10-CM | POA: Diagnosis not present

## 2020-12-15 DIAGNOSIS — I480 Paroxysmal atrial fibrillation: Secondary | ICD-10-CM | POA: Diagnosis not present

## 2020-12-15 DIAGNOSIS — I10 Essential (primary) hypertension: Secondary | ICD-10-CM | POA: Diagnosis not present

## 2020-12-15 DIAGNOSIS — E890 Postprocedural hypothyroidism: Secondary | ICD-10-CM | POA: Diagnosis not present

## 2020-12-15 DIAGNOSIS — E039 Hypothyroidism, unspecified: Secondary | ICD-10-CM | POA: Diagnosis not present

## 2020-12-15 MED ORDER — POTASSIUM CHLORIDE ER 10 MEQ PO TBCR: 20.0000 meq | EXTENDED_RELEASE_TABLET | Freq: Every day | ORAL | 3 refills | Status: DC

## 2020-12-19 ENCOUNTER — Other Ambulatory Visit (HOSPITAL_COMMUNITY): Payer: Self-pay | Admitting: *Deleted

## 2020-12-19 MED ORDER — POTASSIUM CHLORIDE ER 10 MEQ PO TBCR: 20.0000 meq | EXTENDED_RELEASE_TABLET | Freq: Every day | ORAL | 3 refills | Status: DC

## 2020-12-26 ENCOUNTER — Other Ambulatory Visit (HOSPITAL_COMMUNITY): Payer: Self-pay | Admitting: *Deleted

## 2020-12-26 MED ORDER — POTASSIUM CHLORIDE ER 10 MEQ PO TBCR: 20.0000 meq | EXTENDED_RELEASE_TABLET | Freq: Every day | ORAL | 3 refills | Status: DC

## 2020-12-28 DIAGNOSIS — E876 Hypokalemia: Secondary | ICD-10-CM | POA: Diagnosis not present

## 2020-12-28 DIAGNOSIS — I480 Paroxysmal atrial fibrillation: Secondary | ICD-10-CM | POA: Diagnosis not present

## 2020-12-28 DIAGNOSIS — E782 Mixed hyperlipidemia: Secondary | ICD-10-CM | POA: Diagnosis not present

## 2020-12-28 DIAGNOSIS — E1140 Type 2 diabetes mellitus with diabetic neuropathy, unspecified: Secondary | ICD-10-CM | POA: Diagnosis not present

## 2020-12-28 DIAGNOSIS — I10 Essential (primary) hypertension: Secondary | ICD-10-CM | POA: Diagnosis not present

## 2021-01-17 ENCOUNTER — Encounter: Payer: Self-pay | Admitting: Podiatry

## 2021-01-17 ENCOUNTER — Ambulatory Visit (INDEPENDENT_AMBULATORY_CARE_PROVIDER_SITE_OTHER): Payer: Medicare HMO | Admitting: Podiatry

## 2021-01-17 ENCOUNTER — Other Ambulatory Visit: Payer: Self-pay

## 2021-01-17 DIAGNOSIS — M79674 Pain in right toe(s): Secondary | ICD-10-CM

## 2021-01-17 DIAGNOSIS — M79675 Pain in left toe(s): Secondary | ICD-10-CM | POA: Diagnosis not present

## 2021-01-17 DIAGNOSIS — B351 Tinea unguium: Secondary | ICD-10-CM

## 2021-01-17 DIAGNOSIS — E1142 Type 2 diabetes mellitus with diabetic polyneuropathy: Secondary | ICD-10-CM

## 2021-01-18 DIAGNOSIS — E1140 Type 2 diabetes mellitus with diabetic neuropathy, unspecified: Secondary | ICD-10-CM | POA: Diagnosis not present

## 2021-01-18 DIAGNOSIS — E890 Postprocedural hypothyroidism: Secondary | ICD-10-CM | POA: Diagnosis not present

## 2021-01-18 DIAGNOSIS — E782 Mixed hyperlipidemia: Secondary | ICD-10-CM | POA: Diagnosis not present

## 2021-01-18 DIAGNOSIS — I10 Essential (primary) hypertension: Secondary | ICD-10-CM | POA: Diagnosis not present

## 2021-01-18 DIAGNOSIS — I480 Paroxysmal atrial fibrillation: Secondary | ICD-10-CM | POA: Diagnosis not present

## 2021-01-18 DIAGNOSIS — E039 Hypothyroidism, unspecified: Secondary | ICD-10-CM | POA: Diagnosis not present

## 2021-01-22 NOTE — Progress Notes (Signed)
Subjective: Beth Lawson is a 80 y.o. female patient seen today for follow up of  painful thick toenails that are difficult to trim. Pain interferes with ambulation. Aggravating factors include wearing enclosed shoe gear. Pain is relieved with periodic professional debridement.  Patient is diabetic.  Patient does not routinely monitor his/her blood glucose.  She states her husband is in rehab as he has lost the ability to ambulate.   Patient states her PCP is managing her lower extremity edema.  PCP is [name], FNP. Last visit was: 08/03/2020.  New problems reported today: None.  No Known Allergies  PCP is [name], FNP .  Objective: Physical Exam  General: Patient is a pleasant 80 y.o. African American female obese in NAD. AAO x 3.   Neurovascular Examination:Capillary fill time to digits <3 seconds b/l lower extremities. Faintly palpable DP pulse(s) b/l lower extremities. Faintly palpable PT pulse(s) b/l lower extremities. Pedal hair absent. Lower extremity skin temperature gradient within normal limits. No pain with calf compression b/l. Lymphedema present b/l lower extremities. No ischemia or gangrene noted b/l lower extremities.  Protective sensation diminished with 10g monofilament b/l.  Dermatological:  No open wounds b/l lower extremities. No interdigital macerations b/l lower extremities. Toenails 1-5 b/l elongated, discolored, dystrophic, thickened, crumbly with subungual debris and tenderness to dorsal palpation. Skin b/l lower extremities noted to be thickened and brawny consistent with lymphedema.  Musculoskeletal:  Normal muscle strength 5/5 to all lower extremity muscle groups bilaterally. No pain crepitus or joint limitation noted with ROM b/l lower extremities. Hallux valgus with bunion deformity noted b/l lower extremities. Hammertoe(s) noted to the 2-5 bilaterally.  Assessment: 1. Pain due to onychomycosis of toenails of both feet   2. Diabetic  peripheral neuropathy associated with type 2 diabetes mellitus (HCC)    Plan: Patient was evaluated and treated and all questions answered. Consent given for treatment as described below: -No new findings. No new orders. -Continue diabetic foot care principles: inspect feet daily, monitor glucose as recommended by PCP and/or Endocrinologist, and follow prescribed diet per PCP, Endocrinologist and/or dietician. -Patient to continue soft, supportive shoe gear daily. -Toenails 1-5 b/l were debrided in length and girth with sterile nail nippers and dremel without iatrogenic bleeding.  -Patient to report any pedal injuries to medical professional immediately. -Patient/POA to call should there be question/concern in the interim.  Return in about 3 months (around 04/18/2021).  [name], [name]

## 2021-02-19 DIAGNOSIS — I10 Essential (primary) hypertension: Secondary | ICD-10-CM | POA: Diagnosis not present

## 2021-02-19 DIAGNOSIS — E039 Hypothyroidism, unspecified: Secondary | ICD-10-CM | POA: Diagnosis not present

## 2021-02-19 DIAGNOSIS — E782 Mixed hyperlipidemia: Secondary | ICD-10-CM | POA: Diagnosis not present

## 2021-02-19 DIAGNOSIS — E890 Postprocedural hypothyroidism: Secondary | ICD-10-CM | POA: Diagnosis not present

## 2021-02-19 DIAGNOSIS — E1140 Type 2 diabetes mellitus with diabetic neuropathy, unspecified: Secondary | ICD-10-CM | POA: Diagnosis not present

## 2021-02-19 DIAGNOSIS — I480 Paroxysmal atrial fibrillation: Secondary | ICD-10-CM | POA: Diagnosis not present

## 2021-03-08 ENCOUNTER — Other Ambulatory Visit (HOSPITAL_COMMUNITY): Payer: Self-pay | Admitting: Nurse Practitioner

## 2021-03-15 DIAGNOSIS — Z0000 Encounter for general adult medical examination without abnormal findings: Secondary | ICD-10-CM | POA: Diagnosis not present

## 2021-03-15 DIAGNOSIS — S81801A Unspecified open wound, right lower leg, initial encounter: Secondary | ICD-10-CM | POA: Diagnosis not present

## 2021-03-15 DIAGNOSIS — Z23 Encounter for immunization: Secondary | ICD-10-CM | POA: Diagnosis not present

## 2021-03-15 DIAGNOSIS — I10 Essential (primary) hypertension: Secondary | ICD-10-CM | POA: Diagnosis not present

## 2021-03-15 DIAGNOSIS — E1140 Type 2 diabetes mellitus with diabetic neuropathy, unspecified: Secondary | ICD-10-CM | POA: Diagnosis not present

## 2021-03-15 DIAGNOSIS — E890 Postprocedural hypothyroidism: Secondary | ICD-10-CM | POA: Diagnosis not present

## 2021-03-15 DIAGNOSIS — N1831 Chronic kidney disease, stage 3a: Secondary | ICD-10-CM | POA: Diagnosis not present

## 2021-03-15 DIAGNOSIS — I480 Paroxysmal atrial fibrillation: Secondary | ICD-10-CM | POA: Diagnosis not present

## 2021-03-15 DIAGNOSIS — R600 Localized edema: Secondary | ICD-10-CM | POA: Diagnosis not present

## 2021-03-21 DIAGNOSIS — I480 Paroxysmal atrial fibrillation: Secondary | ICD-10-CM | POA: Diagnosis not present

## 2021-03-21 DIAGNOSIS — N1831 Chronic kidney disease, stage 3a: Secondary | ICD-10-CM | POA: Diagnosis not present

## 2021-03-21 DIAGNOSIS — E1140 Type 2 diabetes mellitus with diabetic neuropathy, unspecified: Secondary | ICD-10-CM | POA: Diagnosis not present

## 2021-03-21 DIAGNOSIS — E890 Postprocedural hypothyroidism: Secondary | ICD-10-CM | POA: Diagnosis not present

## 2021-03-21 DIAGNOSIS — E039 Hypothyroidism, unspecified: Secondary | ICD-10-CM | POA: Diagnosis not present

## 2021-03-21 DIAGNOSIS — E782 Mixed hyperlipidemia: Secondary | ICD-10-CM | POA: Diagnosis not present

## 2021-03-21 DIAGNOSIS — I10 Essential (primary) hypertension: Secondary | ICD-10-CM | POA: Diagnosis not present

## 2021-03-26 DIAGNOSIS — I10 Essential (primary) hypertension: Secondary | ICD-10-CM | POA: Diagnosis not present

## 2021-03-26 DIAGNOSIS — R600 Localized edema: Secondary | ICD-10-CM | POA: Diagnosis not present

## 2021-03-26 DIAGNOSIS — N1831 Chronic kidney disease, stage 3a: Secondary | ICD-10-CM | POA: Diagnosis not present

## 2021-03-26 DIAGNOSIS — E209 Hypoparathyroidism, unspecified: Secondary | ICD-10-CM | POA: Diagnosis not present

## 2021-03-26 DIAGNOSIS — S81801D Unspecified open wound, right lower leg, subsequent encounter: Secondary | ICD-10-CM | POA: Diagnosis not present

## 2021-03-26 DIAGNOSIS — Z7984 Long term (current) use of oral hypoglycemic drugs: Secondary | ICD-10-CM | POA: Diagnosis not present

## 2021-03-26 DIAGNOSIS — E1140 Type 2 diabetes mellitus with diabetic neuropathy, unspecified: Secondary | ICD-10-CM | POA: Diagnosis not present

## 2021-04-04 DIAGNOSIS — I480 Paroxysmal atrial fibrillation: Secondary | ICD-10-CM | POA: Diagnosis not present

## 2021-04-04 DIAGNOSIS — N1831 Chronic kidney disease, stage 3a: Secondary | ICD-10-CM | POA: Diagnosis not present

## 2021-04-04 DIAGNOSIS — R600 Localized edema: Secondary | ICD-10-CM | POA: Diagnosis not present

## 2021-04-04 DIAGNOSIS — E209 Hypoparathyroidism, unspecified: Secondary | ICD-10-CM | POA: Diagnosis not present

## 2021-04-10 ENCOUNTER — Other Ambulatory Visit: Payer: Self-pay

## 2021-04-10 ENCOUNTER — Ambulatory Visit (INDEPENDENT_AMBULATORY_CARE_PROVIDER_SITE_OTHER): Payer: Medicare HMO | Admitting: Podiatry

## 2021-04-10 DIAGNOSIS — M2141 Flat foot [pes planus] (acquired), right foot: Secondary | ICD-10-CM | POA: Diagnosis not present

## 2021-04-10 DIAGNOSIS — N1831 Chronic kidney disease, stage 3a: Secondary | ICD-10-CM | POA: Insufficient documentation

## 2021-04-10 DIAGNOSIS — E1142 Type 2 diabetes mellitus with diabetic polyneuropathy: Secondary | ICD-10-CM

## 2021-04-10 DIAGNOSIS — M79675 Pain in left toe(s): Secondary | ICD-10-CM | POA: Diagnosis not present

## 2021-04-10 DIAGNOSIS — M79674 Pain in right toe(s): Secondary | ICD-10-CM | POA: Diagnosis not present

## 2021-04-10 DIAGNOSIS — E119 Type 2 diabetes mellitus without complications: Secondary | ICD-10-CM | POA: Diagnosis not present

## 2021-04-10 DIAGNOSIS — E209 Hypoparathyroidism, unspecified: Secondary | ICD-10-CM | POA: Insufficient documentation

## 2021-04-10 DIAGNOSIS — B351 Tinea unguium: Secondary | ICD-10-CM | POA: Diagnosis not present

## 2021-04-10 DIAGNOSIS — Z87828 Personal history of other (healed) physical injury and trauma: Secondary | ICD-10-CM

## 2021-04-10 DIAGNOSIS — M2142 Flat foot [pes planus] (acquired), left foot: Secondary | ICD-10-CM

## 2021-04-10 DIAGNOSIS — I480 Paroxysmal atrial fibrillation: Secondary | ICD-10-CM | POA: Insufficient documentation

## 2021-04-10 NOTE — Progress Notes (Signed)
ANNUAL DIABETIC FOOT EXAM  Subjective: Beth Lawson presents today for for annual diabetic foot examination, at risk foot care with history of diabetic neuropathy, and painful elongated mycotic toenails 1-5 bilaterally which are tender when wearing enclosed shoe gear. Pain is relieved with periodic professional debridement..  Patient relates several year h/o diabetes.  Patient does relate h/o leg wounds.   Patient relates symptoms of foot numbness.  Patient's blood sugar was 97 mg/dl today.   Kristen Loader, FNP is patient's PCP. Last visit was 08/03/2020.  Past Medical History:  Diagnosis Date   Diabetes mellitus, type 2 ([place])    HTN (hypertension)    Hyperlipidemia    Hypothyroidism    Seizures ([place])    Patient Active Problem List   Diagnosis Date Noted   Chronic kidney disease, stage 3a ([place]) 04/10/2021   Hypoparathyroidism ([place]) 04/10/2021   Paroxysmal atrial fibrillation ([place]) 04/10/2021   Skin ulcer ([place]) 07/11/2020   Bilateral lower extremity edema 04/14/2020   Body mass index (BMI) 40.0-44.9, adult ([place]) 04/14/2020   Cardiac murmur, unspecified 04/14/2020   Diabetic peripheral neuropathy associated with type 2 diabetes mellitus ([place]) 04/14/2020   Essential hypertension 04/14/2020   Hypothyroid 04/14/2020   Mixed hyperlipidemia 04/14/2020   Morbid obesity ([place]) 04/14/2020   Postoperative hypothyroidism 04/14/2020   Seizure disorder ([place]) 04/14/2020   Urinary incontinence 04/14/2020   Past Surgical History:  Procedure Laterality Date   ABDOMINAL HYSTERECTOMY     BREAST SURGERY     THYROIDECTOMY  1988   Current Outpatient Medications on File Prior to Visit  Medication Sig Dispense Refill   Alcohol Swabs (B-D SINGLE USE SWABS REGULAR) PADS See admin instructions.     glucose blood (TRUE METRIX BLOOD GLUCOSE TEST) test strip Use to test your blood sugar     TRUEplus Lancets 33G MISC Use to test your blood sugar     amLODipine (NORVASC) 10 MG tablet Take 10 mg  by mouth daily.     apixaban (ELIQUIS) 5 MG TABS tablet Take 1 tablet (5 mg total) by mouth 2 (two) times daily. 180 tablet 2   atorvastatin (LIPITOR) 80 MG tablet Take 80 mg by mouth daily.     azelastine (OPTIVAR) 0.05 % ophthalmic solution Place 1 drop into both eyes daily.     Blood Glucose Calibration (TRUE METRIX LEVEL 1) Low SOLN      Blood Glucose Monitoring Suppl (TRUE METRIX METER) w/Device KIT      ezetimibe (ZETIA) 10 MG tablet 1 tablet     furosemide (LASIX) 40 MG tablet Take 40 mg by mouth daily.     levETIRAcetam (KEPPRA) 500 MG tablet Take 500 mg by mouth 2 (two) times daily.     levothyroxine (SYNTHROID) 175 MCG tablet Take 175 mcg by mouth daily before breakfast.     losartan-hydrochlorothiazide (HYZAAR) 50-12.5 MG tablet Take 1 tablet by mouth daily.     metFORMIN (GLUCOPHAGE) 500 MG tablet Take 1 tablet by mouth 2 (two) times daily with a meal.     metoprolol succinate (TOPROL-XL) 100 MG 24 hr tablet 1 tablet     Multiple Vitamins-Minerals (CENTRUM SILVER 50+WOMEN) TABS Take 1 tablet by mouth daily.     potassium chloride (KLOR-CON) 10 MEQ tablet 2 tablets     No current facility-administered medications on file prior to visit.    No Known Allergies Social History   Occupational History   Not on file  Tobacco Use   Smoking status: Never   Smokeless tobacco:  Never  Substance and Sexual Activity   Alcohol use: Never   Drug use: Never   Sexual activity: Not on file   No family history on file. Immunization History  Administered Date(s) Administered   Influenza, High Dose Seasonal PF 03/22/2019, 03/14/2020   PFIZER(Purple Top)SARS-COV-2 Vaccination 07/17/2019, 08/07/2019   Zoster, Live 03/22/2019, 05/24/2019     Review of Systems: Negative except as noted in the HPI.   Objective: There were no vitals filed for this visit.  Beth Lawson is a pleasant 80 y.o. female in NAD. AAO X 3.  Vascular Examination: CFT <3 seconds b/l LE. Faintly palpable DP pulses  b/l LE. Faintly palpable PT pulse(s) b/l LE. Pedal hair absent. Lower extremity skin temperature gradient within normal limits. Nonpitting edema noted BLE. Evidence of chronic venous insufficiency b/l LE. No ischemia or gangrene noted b/l LE. No cyanosis or clubbing noted b/l LE.  Dermatological Examination: Healed ulcer anterior shin area RLE.Marland Kitchen No interdigital macerations noted b/l LE. Toenails 1-5 b/l elongated, discolored, dystrophic, thickened, crumbly with subungual debris and tenderness to dorsal palpation.  Musculoskeletal Examination: Muscle strength 5/5 to all lower extremity muscle groups bilaterally. HAV with bunion deformity noted b/l LE. Hammertoe deformity noted 2-5 b/l. Utilizes rollator for ambulation assistance.  Footwear Assessment: Does the patient wear appropriate shoes? Yes. Does the patient need inserts/orthotics? Yes.  Neurological Examination: Pt has subjective symptoms of neuropathy. Protective sensation intact 5/5 intact bilaterally with 10g monofilament b/l.  Assessment: 1. Pain due to onychomycosis of toenails of both feet   2. Pes planus of both feet   3. History of open leg wound   4. Diabetic peripheral neuropathy associated with type 2 diabetes mellitus ([place])   5. Encounter for diabetic foot exam ([place])      ADA Risk Categorization:  High Risk  Patient has one or more of the following: Loss of protective sensation Absent pedal pulses Severe Foot deformity History of foot ulcer  Plan: -Diabetic foot examination performed today. -Continue foot and shoe inspections daily. Monitor blood glucose per PCP/Endocrinologist's recommendations. -Mycotic toenails 1-5 bilaterally were debrided in length and girth with sterile nail nippers and dremel without incident. -Patient/POA to call should there be question/concern in the interim.  Return in about 3 months (around 07/09/2021).  Marzetta Board, [name]

## 2021-04-15 ENCOUNTER — Encounter: Payer: Self-pay | Admitting: Podiatry

## 2021-07-17 ENCOUNTER — Ambulatory Visit: Payer: Medicare HMO | Admitting: Podiatry

## 2021-08-15 ENCOUNTER — Ambulatory Visit: Payer: Medicare HMO | Admitting: Podiatry

## 2021-08-27 ENCOUNTER — Ambulatory Visit (INDEPENDENT_AMBULATORY_CARE_PROVIDER_SITE_OTHER): Payer: Medicare HMO | Admitting: Podiatry

## 2021-08-27 ENCOUNTER — Encounter: Payer: Self-pay | Admitting: Podiatry

## 2021-08-27 DIAGNOSIS — M79674 Pain in right toe(s): Secondary | ICD-10-CM

## 2021-08-27 DIAGNOSIS — E1142 Type 2 diabetes mellitus with diabetic polyneuropathy: Secondary | ICD-10-CM | POA: Diagnosis not present

## 2021-08-27 DIAGNOSIS — H2513 Age-related nuclear cataract, bilateral: Secondary | ICD-10-CM | POA: Diagnosis not present

## 2021-08-27 DIAGNOSIS — B351 Tinea unguium: Secondary | ICD-10-CM | POA: Diagnosis not present

## 2021-08-27 DIAGNOSIS — M79675 Pain in left toe(s): Secondary | ICD-10-CM | POA: Diagnosis not present

## 2021-09-04 NOTE — Progress Notes (Signed)
?  Subjective:  ?Patient ID: Beth Lawson, female    DOB: 1940-06-23,  MRN: [id number] ? ?Beth Lawson presents to clinic today for at risk foot care with history of diabetic neuropathy and painful thick toenails that are difficult to trim. Pain interferes with ambulation. Aggravating factors include wearing enclosed shoe gear. Pain is relieved with periodic professional debridement. ? ?Patient states blood glucose was 106 mg/dl today.  Last known HgA1c was in the 6% range. ? ?New problem(s): None.  ? ?Patient has known chronic LE edema with h/o ulcer. ? ?PCP is Kristen Loader, FNP , and last visit was July 12, 2021. ? ?No Known Allergies ? ?Review of Systems: Negative except as noted in the HPI. ? ?Objective: No changes noted in today's physical examination. ?There were no vitals filed for this visit. ? ?Beth Lawson is a pleasant 81 y.o. female in NAD. AAO X 3. ? ?Vascular Examination: ?CFT <3 seconds b/l LE. Faintly palpable DP pulses b/l LE. Faintly palpable PT pulse(s) b/l LE. Pedal hair absent. Lower extremity skin temperature gradient within normal limits. Nonpitting edema noted BLE. Evidence of chronic venous insufficiency b/l LE. No ischemia or gangrene noted b/l LE. No cyanosis or clubbing noted b/l LE. ? ?Dermatological Examination: ?Healed ulcer anterior shin area RLE.Marland Kitchen No interdigital macerations noted b/l LE. Toenails 1-5 b/l elongated, discolored, dystrophic, thickened, crumbly with subungual debris and tenderness to dorsal palpation. ? ?Musculoskeletal Examination: ?Muscle strength 5/5 to all lower extremity muscle groups bilaterally. HAV with bunion deformity noted b/l LE. Hammertoe deformity noted 2-5 b/l. Utilizes rollator for ambulation assistance. ? ?Neurological Examination: ?Pt has subjective symptoms of neuropathy. Protective sensation intact 5/5 intact bilaterally with 10g monofilament b/l. ? ?Assessment/Plan: ?1. Pain due to onychomycosis of toenails of both feet   ?2. Diabetic  peripheral neuropathy associated with type 2 diabetes mellitus ([place])   ?  ? ?-Patient was evaluated and treated. All patient's and/or POA's questions/concerns answered on today's visit. ?-Continue elevation to minimize lower extremity edema. ?-Continue diabetic foot care principles: inspect feet daily, monitor glucose as recommended by PCP and/or Endocrinologist, and follow prescribed diet per PCP, Endocrinologist and/or dietician. ?-Continue diabetic shoes daily. ?-Toenails 1-5 b/l were debrided in length and girth with sterile nail nippers and dremel without iatrogenic bleeding.  ?-Patient/POA to call should there be question/concern in the interim.  ? ?Return in about 3 months (around 11/27/2021). ? ?Marzetta Board, [name]  ?

## 2021-10-31 DIAGNOSIS — N1831 Chronic kidney disease, stage 3a: Secondary | ICD-10-CM | POA: Diagnosis not present

## 2021-10-31 DIAGNOSIS — E782 Mixed hyperlipidemia: Secondary | ICD-10-CM | POA: Diagnosis not present

## 2021-10-31 DIAGNOSIS — E890 Postprocedural hypothyroidism: Secondary | ICD-10-CM | POA: Diagnosis not present

## 2021-10-31 DIAGNOSIS — I10 Essential (primary) hypertension: Secondary | ICD-10-CM | POA: Diagnosis not present

## 2021-10-31 DIAGNOSIS — E1140 Type 2 diabetes mellitus with diabetic neuropathy, unspecified: Secondary | ICD-10-CM | POA: Diagnosis not present

## 2021-11-20 DIAGNOSIS — D6869 Other thrombophilia: Secondary | ICD-10-CM | POA: Diagnosis not present

## 2021-11-20 DIAGNOSIS — I480 Paroxysmal atrial fibrillation: Secondary | ICD-10-CM | POA: Diagnosis not present

## 2021-11-20 DIAGNOSIS — I10 Essential (primary) hypertension: Secondary | ICD-10-CM | POA: Diagnosis not present

## 2021-11-20 DIAGNOSIS — E1140 Type 2 diabetes mellitus with diabetic neuropathy, unspecified: Secondary | ICD-10-CM | POA: Diagnosis not present

## 2021-11-20 DIAGNOSIS — E209 Hypoparathyroidism, unspecified: Secondary | ICD-10-CM | POA: Diagnosis not present

## 2021-11-20 DIAGNOSIS — N1831 Chronic kidney disease, stage 3a: Secondary | ICD-10-CM | POA: Diagnosis not present

## 2021-11-20 DIAGNOSIS — R6889 Other general symptoms and signs: Secondary | ICD-10-CM | POA: Diagnosis not present

## 2021-11-28 ENCOUNTER — Encounter: Payer: Self-pay | Admitting: Podiatry

## 2021-11-28 ENCOUNTER — Ambulatory Visit (INDEPENDENT_AMBULATORY_CARE_PROVIDER_SITE_OTHER): Payer: Medicare HMO | Admitting: Podiatry

## 2021-11-28 DIAGNOSIS — B351 Tinea unguium: Secondary | ICD-10-CM | POA: Diagnosis not present

## 2021-11-28 DIAGNOSIS — E1142 Type 2 diabetes mellitus with diabetic polyneuropathy: Secondary | ICD-10-CM | POA: Diagnosis not present

## 2021-11-28 DIAGNOSIS — M79674 Pain in right toe(s): Secondary | ICD-10-CM

## 2021-11-28 DIAGNOSIS — R6889 Other general symptoms and signs: Secondary | ICD-10-CM | POA: Diagnosis not present

## 2021-11-28 DIAGNOSIS — M79675 Pain in left toe(s): Secondary | ICD-10-CM

## 2021-12-04 NOTE — Progress Notes (Signed)
  Subjective:  Patient ID: Beth Lawson, female    DOB: 1940-10-05,  MRN: [id number]  Beth Lawson presents to clinic today for at risk foot care with history of diabetic neuropathy and painful elongated mycotic toenails 1-5 bilaterally which are tender when wearing enclosed shoe gear. Pain is relieved with periodic professional debridement.  Patient states blood glucose was 80 mg/dl today.  Last known HgA1c was unknown.   New problem(s): None.   PCP is [name], FNP , and last visit was  November 20, 2021.  Sadly, she lost her husband, Fayrene Fearing, on October 31, 2021.  No Known Allergies  Review of Systems: Negative except as noted in the HPI.  Objective: There were no vitals filed for this visit.  Beth Lawson is a pleasant 81 y.o. female in NAD. AAO X 3.  Vascular Examination: CFT <3 seconds b/l LE. Faintly palpable DP pulses b/l LE. Faintly palpable PT pulse(s) b/l LE. Pedal hair absent. Lower extremity skin temperature gradient within normal limits. Nonpitting edema noted BLE. Evidence of chronic venous insufficiency b/l LE. No ischemia or gangrene noted b/l LE. No cyanosis or clubbing noted b/l LE.  Dermatological Examination: Skin warm and supple b/l feet. Scarring from chronic venous insufficiency b/l LE. No interdigital macerations noted b/l LE. Toenails 1-5 b/l elongated, discolored, dystrophic, thickened, crumbly with subungual debris and tenderness to dorsal palpation.  Musculoskeletal Examination: Muscle strength 5/5 to all lower extremity muscle groups bilaterally. HAV with bunion deformity noted b/l LE. Hammertoe deformity noted 2-5 b/l. Utilizes rollator for ambulation assistance.  Neurological Examination: Pt has subjective symptoms of neuropathy. Protective sensation intact 5/5 intact bilaterally with 10g monofilament b/l.  Assessment/Plan: 1. Pain due to onychomycosis of toenails of both feet   2. Diabetic peripheral neuropathy associated with type 2 diabetes  mellitus (HCC)     -Examined patient. -Continue compression hose daily. Elevate feet when at rest to minimize LE edema. -Continue diabetic shoes daily. -Mycotic toenails 1-5 bilaterally were debrided in length and girth with sterile nail nippers and dremel without incident. -Patient/POA to call should there be question/concern in the interim.   Return in about 3 months (around 02/28/2022).  [name], [name]

## 2021-12-26 ENCOUNTER — Ambulatory Visit (HOSPITAL_COMMUNITY)
Admission: RE | Admit: 2021-12-26 | Discharge: 2021-12-26 | Disposition: A | Discharge status: Home / Self Care | Payer: Medicare HMO | Source: Ambulatory Visit | Attending: Nurse Practitioner | Admitting: Nurse Practitioner

## 2021-12-26 ENCOUNTER — Encounter (HOSPITAL_COMMUNITY): Payer: Self-pay | Admitting: Nurse Practitioner

## 2021-12-26 VITALS — BP 150/72 | HR 78 | Ht 68.0 in | Wt 230.6 lb

## 2021-12-26 DIAGNOSIS — I700 Atherosclerosis of aorta: Secondary | ICD-10-CM | POA: Insufficient documentation

## 2021-12-26 DIAGNOSIS — E039 Hypothyroidism, unspecified: Secondary | ICD-10-CM | POA: Insufficient documentation

## 2021-12-26 DIAGNOSIS — I480 Paroxysmal atrial fibrillation: Secondary | ICD-10-CM | POA: Diagnosis not present

## 2021-12-26 DIAGNOSIS — I4891 Unspecified atrial fibrillation: Secondary | ICD-10-CM | POA: Insufficient documentation

## 2021-12-26 DIAGNOSIS — I10 Essential (primary) hypertension: Secondary | ICD-10-CM | POA: Insufficient documentation

## 2021-12-26 DIAGNOSIS — Z79899 Other long term (current) drug therapy: Secondary | ICD-10-CM | POA: Diagnosis not present

## 2021-12-26 DIAGNOSIS — D6869 Other thrombophilia: Secondary | ICD-10-CM | POA: Diagnosis not present

## 2021-12-26 DIAGNOSIS — E119 Type 2 diabetes mellitus without complications: Secondary | ICD-10-CM | POA: Diagnosis not present

## 2021-12-26 DIAGNOSIS — E785 Hyperlipidemia, unspecified: Secondary | ICD-10-CM | POA: Diagnosis not present

## 2021-12-26 DIAGNOSIS — R6889 Other general symptoms and signs: Secondary | ICD-10-CM | POA: Diagnosis not present

## 2021-12-26 DIAGNOSIS — Z7901 Long term (current) use of anticoagulants: Secondary | ICD-10-CM | POA: Diagnosis not present

## 2021-12-26 LAB — CBC
HCT: 38.2 % (ref 36.0–46.0)
Hemoglobin: 12.1 g/dL (ref 12.0–15.0)
MCH: 27.9 pg (ref 26.0–34.0)
MCHC: 31.7 g/dL (ref 30.0–36.0)
MCV: 88.2 fL (ref 80.0–100.0)
Platelets: 287 10*3/uL (ref 150–400)
RBC: 4.33 MIL/uL (ref 3.87–5.11)
RDW: 14.4 % (ref 11.5–15.5)
WBC: 6.3 10*3/uL (ref 4.0–10.5)
nRBC: 0 % (ref 0.0–0.2)

## 2021-12-26 NOTE — Progress Notes (Signed)
Primary Care Physician: Kristen Loader, FNP Referring Physician: ER MD   Harmony Sandell is a 81 y.o. female with a h/o of T2DM, hypertension, hyperlipidemia, seizures & hypothyroidism who presented to the ED 10/11/20 via EMS with complaints of chest discomfort that began shortly prior to arrival, but shortly resolved. Pt felt she got overheated and that brought on the afib. This is a new diagnosis. She was continued on her metoprolol and started on eliquis 5 mg bid for a CHA2DS2VASc score of at least 5. She is in SR today and has not noted any further afib.   F/u in afib clinic, 12/07/20. Echo done this am. Has been on anticoagulation for new onset afib noted in ER in June. Has done well with out any bleeding issues. Ekg shows SR and she has not noted any irregular HR.   F/u in afib clinic,12/26/21.  Pt is in SR. No noted irregular HR.  No voiced complaints today. No issues with anticoagulation. Her husband that was in a SNF passed away last month.   Today, she denies symptoms of palpitations, chest pain, shortness of breath, orthopnea, PND, lower extremity edema, dizziness, presyncope, syncope, or neurologic sequela. The patient is tolerating medications without difficulties and is otherwise without complaint today.   Past Medical History:  Diagnosis Date   Diabetes mellitus, type 2 ([place])    HTN (hypertension)    Hyperlipidemia    Hypothyroidism    Seizures ([place])    Past Surgical History:  Procedure Laterality Date   ABDOMINAL HYSTERECTOMY     BREAST SURGERY     THYROIDECTOMY  1988    Current Outpatient Medications  Medication Sig Dispense Refill   Alcohol Swabs (B-D SINGLE USE SWABS REGULAR) PADS See admin instructions.     amLODipine (NORVASC) 10 MG tablet Take 10 mg by mouth daily.     apixaban (ELIQUIS) 5 MG TABS tablet Take 1 tablet (5 mg total) by mouth 2 (two) times daily. 180 tablet 2   atorvastatin (LIPITOR) 80 MG tablet Take 80 mg by mouth daily.     azelastine (OPTIVAR)  0.05 % ophthalmic solution Place 1 drop into both eyes daily.     Blood Glucose Calibration (TRUE METRIX LEVEL 1) Low SOLN      Blood Glucose Monitoring Suppl (TRUE METRIX METER) w/Device KIT      Calcium Carbonate-Vit D-Min (CALTRATE 600+D PLUS MINERALS) 600-800 MG-UNIT TABS 1 tablet     ezetimibe (ZETIA) 10 MG tablet 1 tablet     furosemide (LASIX) 40 MG tablet Take 40 mg by mouth daily.     glucose blood (TRUE METRIX BLOOD GLUCOSE TEST) test strip Use to test your blood sugar     levETIRAcetam (KEPPRA) 500 MG tablet Take 500 mg by mouth 2 (two) times daily.     levothyroxine (SYNTHROID) 175 MCG tablet Take 175 mcg by mouth daily before breakfast.     losartan-hydrochlorothiazide (HYZAAR) 50-12.5 MG tablet Take 1 tablet by mouth daily.     metFORMIN (GLUCOPHAGE) 500 MG tablet Take 1 tablet by mouth 2 (two) times daily with a meal.     metoprolol succinate (TOPROL-XL) 100 MG 24 hr tablet 1 tablet     Multiple Vitamins-Minerals (CENTRUM SILVER 50+WOMEN) TABS Take 1 tablet by mouth daily.     potassium chloride (KLOR-CON) 10 MEQ tablet 2 tablets     TRUEplus Lancets 33G MISC Use to test your blood sugar     No current facility-administered medications for this encounter.  No Known Allergies  Social History   Socioeconomic History   Marital status: Married    Spouse name: Not on file   Number of children: Not on file   Years of education: Not on file   Highest education level: Not on file  Occupational History   Not on file  Tobacco Use   Smoking status: Never   Smokeless tobacco: Never  Substance and Sexual Activity   Alcohol use: Never   Drug use: Never   Sexual activity: Not on file  Other Topics Concern   Not on file  Social History Narrative   Not on file   Social Determinants of Health   Financial Resource Strain: Not on file  Food Insecurity: Not on file  Transportation Needs: Not on file  Physical Activity: Not on file  Stress: Not on file  Social  Connections: Not on file  Intimate Partner Violence: Not on file    No family history on file.  ROS- All systems are reviewed and negative except as per the HPI above  Physical Exam: Vitals:   12/26/21 1140  Height: _0  (1.727 m)   Wt Readings from Last 3 Encounters:  12/07/20 115 kg  10/18/20 115.2 kg  10/11/20 117 kg    Labs: Lab Results  Component Value Date   NA 141 12/07/2020   K 3.1 (L) 12/07/2020   CL 104 12/07/2020   CO2 29 12/07/2020   GLUCOSE 115 (H) 12/07/2020   BUN 24 (H) 12/07/2020   CREATININE 1.07 (H) 12/07/2020   CALCIUM 8.9 12/07/2020   No results found for: "INR" No results found for: "CHOL", "HDL", "LDLCALC", "TRIG"   GEN- The patient is well appearing, alert and oriented x 3 today.   Head- normocephalic, atraumatic Eyes-  Sclera clear, conjunctiva pink Ears- hearing intact Oropharynx- clear Neck- supple, no JVP Lymph- no cervical lymphadenopathy Lungs- Clear to ausculation bilaterally, normal work of breathing Heart- Regular rate and rhythm, 2/6 systolic  murmurs, rubs or gallops, PMI not laterally displaced1. Left ventricular ejection fraction, by estimation, is 60 to 65%. The  left ventricle has normal function. The left ventricle has no regional  wall motion abnormalities. There is mild left ventricular hypertrophy.  Left ventricular diastolic parameters  are consistent with Grade II diastolic dysfunction (pseudonormalization).  Elevated left ventricular end-diastolic pressure.   2. Right ventricular systolic function is normal. The right ventricular  size is normal. There is normal pulmonary artery systolic pressure.   3. The pericardial effusion is posterior to the left ventricle.   4. The mitral valve is normal in structure. No evidence of mitral valve  regurgitation. No evidence of mitral stenosis.   5. The aortic valve is tricuspid. Aortic valve regurgitation is not  visualized. Mild to moderate aortic valve sclerosis/calcification  is  present, without any evidence of aortic stenosis.   6. The inferior vena cava is normal in size with greater than 50%  respiratory variability, suggesting right atrial pressure of 3 mmHg GI- soft, NT, ND, + BS Extremities- no clubbing, cyanosis, or edema MS- no significant deformity or atrophy Skin- no rash or lesion Psych- euthymic mood, full affect Neuro- strength and sensation are intact   EKG-Vent. rate 78 BPM PR interval 192 ms QRS duration 82 ms QT/QTcB 386/440 ms P-R-T axes 71 57 19 Normal sinus rhythm Normal ECG When compared with ECG of 07-Dec-2020 10:12,    Echo-1. Left ventricular ejection fraction, by estimation, is 60 to 65%. The  left ventricle has  normal function. The left ventricle has no regional  wall motion abnormalities. There is mild left ventricular hypertrophy.  Left ventricular diastolic parameters  are consistent with Grade II diastolic dysfunction (pseudonormalization).  Elevated left ventricular end-diastolic pressure.   2. Right ventricular systolic function is normal. The right ventricular  size is normal. There is normal pulmonary artery systolic pressure.   3. The pericardial effusion is posterior to the left ventricle.   4. The mitral valve is normal in structure. No evidence of mitral valve  regurgitation. No evidence of mitral stenosis.   5. The aortic valve is tricuspid. Aortic valve regurgitation is not  visualized. Mild to moderate aortic valve sclerosis/calcification is  present, without any evidence of aortic stenosis.   6. The inferior vena cava is normal in size with greater than 50%  respiratory variability, suggesting right atrial pressure of 3 mmHg   Assessment and Plan:  1. New onset afib 10/11/20 with ER visit  Pt not aware of any further afib  In SR today  Continue metoprolol succinate 100 mg qd  2. CHA2DS2VASc score of 5 No bleeding noted  Continue eliquis 5 mg bid  Labs reviewed form PCP with normal creatinine  Cbc  done today   E. Mild to mod aortic sclerosis by echo 8/22 Repeat echo   F/u in one year   Butch Penny C. Kojo Liby, [facility] [address] [place], [place] [address] [phone]

## 2022-01-08 ENCOUNTER — Other Ambulatory Visit (HOSPITAL_COMMUNITY): Payer: Self-pay | Admitting: *Deleted

## 2022-01-08 MED ORDER — APIXABAN 5 MG PO TABS: 5.0000 mg | ORAL_TABLET | Freq: Two times a day (BID) | ORAL | 2 refills | Status: DC

## 2022-01-28 ENCOUNTER — Ambulatory Visit (HOSPITAL_COMMUNITY)
Admission: RE | Admit: 2022-01-28 | Discharge: 2022-01-28 | Disposition: A | Discharge status: Home / Self Care | Payer: Medicare HMO | Source: Ambulatory Visit | Attending: Nurse Practitioner | Admitting: Nurse Practitioner

## 2022-01-28 DIAGNOSIS — I10 Essential (primary) hypertension: Secondary | ICD-10-CM | POA: Insufficient documentation

## 2022-01-28 DIAGNOSIS — R079 Chest pain, unspecified: Secondary | ICD-10-CM | POA: Diagnosis not present

## 2022-01-28 DIAGNOSIS — R6889 Other general symptoms and signs: Secondary | ICD-10-CM | POA: Diagnosis not present

## 2022-01-28 DIAGNOSIS — I480 Paroxysmal atrial fibrillation: Secondary | ICD-10-CM | POA: Diagnosis not present

## 2022-01-28 DIAGNOSIS — E119 Type 2 diabetes mellitus without complications: Secondary | ICD-10-CM | POA: Insufficient documentation

## 2022-01-28 DIAGNOSIS — E785 Hyperlipidemia, unspecified: Secondary | ICD-10-CM | POA: Insufficient documentation

## 2022-01-28 DIAGNOSIS — I071 Rheumatic tricuspid insufficiency: Secondary | ICD-10-CM | POA: Insufficient documentation

## 2022-01-28 LAB — ECHOCARDIOGRAM COMPLETE
Area-P 1/2: 4.35 cm2
S' Lateral: 2.2 cm

## 2022-02-08 ENCOUNTER — Encounter (HOSPITAL_COMMUNITY): Payer: Self-pay | Admitting: *Deleted

## 2022-02-22 ENCOUNTER — Other Ambulatory Visit (HOSPITAL_COMMUNITY): Payer: Self-pay | Admitting: Nurse Practitioner

## 2022-03-05 ENCOUNTER — Other Ambulatory Visit (HOSPITAL_COMMUNITY): Payer: Self-pay | Admitting: *Deleted

## 2022-03-05 MED ORDER — POTASSIUM CHLORIDE ER 10 MEQ PO TBCR: 20.0000 meq | EXTENDED_RELEASE_TABLET | Freq: Every day | ORAL | 2 refills | Status: DC

## 2022-03-13 ENCOUNTER — Encounter: Payer: Self-pay | Admitting: Podiatry

## 2022-03-13 ENCOUNTER — Ambulatory Visit: Payer: Medicare HMO | Admitting: Podiatry

## 2022-03-13 DIAGNOSIS — B351 Tinea unguium: Secondary | ICD-10-CM

## 2022-03-13 DIAGNOSIS — M79674 Pain in right toe(s): Secondary | ICD-10-CM

## 2022-03-13 DIAGNOSIS — E1142 Type 2 diabetes mellitus with diabetic polyneuropathy: Secondary | ICD-10-CM | POA: Diagnosis not present

## 2022-03-13 DIAGNOSIS — M79675 Pain in left toe(s): Secondary | ICD-10-CM | POA: Diagnosis not present

## 2022-03-19 NOTE — Progress Notes (Signed)
  Subjective:  Patient ID: Beth Lawson, female    DOB: Mar 16, 1941,  MRN: [id number]  Beth Lawson presents to clinic today for at risk foot care with history of diabetic neuropathy and painful thick toenails that are difficult to trim. Pain interferes with ambulation. Aggravating factors include wearing enclosed shoe gear. Pain is relieved with periodic professional debridement.  Chief Complaint  Patient presents with   Nail Problem    Diabetic foot care BS-82 A1C-Do not know PCP-Andrew Brake PCP VST-6 months ago    New problem(s): None.   PCP is [name], FNP.  No Known Allergies  Review of Systems: Negative except as noted in the HPI.  Objective: No changes noted in today's physical examination.  Beth Lawson is a pleasant 81 y.o. female morbidly obese in NAD. AAO x 3.  Vascular Examination: CFT <3 seconds b/l LE. Faintly palpable DP pulses b/l LE. Faintly palpable PT pulse(s) b/l LE. Pedal hair absent. Lower extremity skin temperature gradient within normal limits. Nonpitting edema noted BLE. Evidence of chronic venous insufficiency b/l LE. No ischemia or gangrene noted b/l LE. No cyanosis or clubbing noted b/l LE.  Dermatological Examination: Skin warm and supple b/l feet. Scarring from chronic venous insufficiency b/l LE. No interdigital macerations noted b/l LE. Toenails 1-5 b/l elongated, discolored, dystrophic, thickened, crumbly with subungual debris and tenderness to dorsal palpation.  Musculoskeletal Examination: Muscle strength 5/5 to all lower extremity muscle groups bilaterally. HAV with bunion deformity noted b/l LE. Hammertoe deformity noted 2-5 b/l. Utilizes rollator for ambulation assistance.  Neurological Examination: Pt has subjective symptoms of neuropathy. Protective sensation intact 5/5 intact bilaterally with 10g monofilament b/l.  Assessment/Plan: 1. Pain due to onychomycosis of toenails of both feet   2. Diabetic peripheral neuropathy  associated with type 2 diabetes mellitus (HCC)     No orders of the defined types were placed in this encounter.   -Consent given for treatment as described below: -Continue diabetic foot care principles: inspect feet daily, monitor glucose as recommended by PCP and/or Endocrinologist, and follow prescribed diet per PCP, Endocrinologist and/or dietician. -Continue diabetic shoes daily. -Toenails 1-5 b/l were debrided in length and girth with sterile nail nippers and dremel without iatrogenic bleeding.  -Patient/POA to call should there be question/concern in the interim.   Return in about 3 months (around 06/13/2022).  [name], [name]

## 2022-05-01 DIAGNOSIS — E782 Mixed hyperlipidemia: Secondary | ICD-10-CM | POA: Diagnosis not present

## 2022-05-01 DIAGNOSIS — I480 Paroxysmal atrial fibrillation: Secondary | ICD-10-CM | POA: Diagnosis not present

## 2022-05-01 DIAGNOSIS — I10 Essential (primary) hypertension: Secondary | ICD-10-CM | POA: Diagnosis not present

## 2022-05-01 DIAGNOSIS — E039 Hypothyroidism, unspecified: Secondary | ICD-10-CM | POA: Diagnosis not present

## 2022-05-01 DIAGNOSIS — E890 Postprocedural hypothyroidism: Secondary | ICD-10-CM | POA: Diagnosis not present

## 2022-05-01 DIAGNOSIS — N1831 Chronic kidney disease, stage 3a: Secondary | ICD-10-CM | POA: Diagnosis not present

## 2022-05-01 DIAGNOSIS — E1140 Type 2 diabetes mellitus with diabetic neuropathy, unspecified: Secondary | ICD-10-CM | POA: Diagnosis not present

## 2022-06-06 ENCOUNTER — Encounter (HOSPITAL_COMMUNITY): Payer: Self-pay | Admitting: *Deleted

## 2022-06-19 DIAGNOSIS — Z136 Encounter for screening for cardiovascular disorders: Secondary | ICD-10-CM | POA: Diagnosis not present

## 2022-06-19 DIAGNOSIS — I5032 Chronic diastolic (congestive) heart failure: Secondary | ICD-10-CM | POA: Diagnosis not present

## 2022-06-19 DIAGNOSIS — I480 Paroxysmal atrial fibrillation: Secondary | ICD-10-CM | POA: Diagnosis not present

## 2022-06-19 DIAGNOSIS — E1121 Type 2 diabetes mellitus with diabetic nephropathy: Secondary | ICD-10-CM | POA: Diagnosis not present

## 2022-06-19 DIAGNOSIS — E782 Mixed hyperlipidemia: Secondary | ICD-10-CM | POA: Diagnosis not present

## 2022-06-19 DIAGNOSIS — E1140 Type 2 diabetes mellitus with diabetic neuropathy, unspecified: Secondary | ICD-10-CM | POA: Diagnosis not present

## 2022-06-19 DIAGNOSIS — D6869 Other thrombophilia: Secondary | ICD-10-CM | POA: Diagnosis not present

## 2022-06-19 DIAGNOSIS — N1831 Chronic kidney disease, stage 3a: Secondary | ICD-10-CM | POA: Diagnosis not present

## 2022-06-19 DIAGNOSIS — I10 Essential (primary) hypertension: Secondary | ICD-10-CM | POA: Diagnosis not present

## 2022-06-19 DIAGNOSIS — Z0000 Encounter for general adult medical examination without abnormal findings: Secondary | ICD-10-CM | POA: Diagnosis not present

## 2022-06-19 DIAGNOSIS — E209 Hypoparathyroidism, unspecified: Secondary | ICD-10-CM | POA: Diagnosis not present

## 2022-06-19 LAB — COMPREHENSIVE METABOLIC PANEL: EGFR: 65

## 2022-07-03 ENCOUNTER — Ambulatory Visit: Payer: Medicare HMO | Admitting: Podiatry

## 2022-07-25 ENCOUNTER — Encounter: Payer: Self-pay | Admitting: Podiatry

## 2022-07-25 ENCOUNTER — Ambulatory Visit (INDEPENDENT_AMBULATORY_CARE_PROVIDER_SITE_OTHER): Payer: Medicare HMO | Admitting: Podiatry

## 2022-07-25 DIAGNOSIS — M79675 Pain in left toe(s): Secondary | ICD-10-CM | POA: Diagnosis not present

## 2022-07-25 DIAGNOSIS — M79674 Pain in right toe(s): Secondary | ICD-10-CM

## 2022-07-25 DIAGNOSIS — B351 Tinea unguium: Secondary | ICD-10-CM

## 2022-07-25 DIAGNOSIS — E1142 Type 2 diabetes mellitus with diabetic polyneuropathy: Secondary | ICD-10-CM

## 2022-07-25 NOTE — Progress Notes (Signed)
  Subjective:  Patient ID: Beth Lawson, female    DOB: 1941-02-01,  MRN: [id number]  Chief Complaint  Patient presents with   Debridement    Trim toenails - diabetic - last A1c was 6.0    82 y.o. female presents with the above complaint. History confirmed with patient. Patient presenting with pain related to dystrophic thickened elongated nails. Patient is unable to trim own nails related to nail dystrophy and/or mobility issues. Patient does have a history of T2DM.   Objective:  Physical Exam: warm, good capillary refill nail exam onychomycosis of the toenails, onycholysis, and dystrophic nails DP pulses palpable, PT pulses palpable, and protective sensation absent Left Foot:  Pain with palpation of nails due to elongation and dystrophic growth.  Right Foot: Pain with palpation of nails due to elongation and dystrophic growth.   Assessment:   1. Pain due to onychomycosis of toenails of both feet   2. Diabetic peripheral neuropathy associated with type 2 diabetes mellitus ([place])      Plan:  Patient was evaluated and treated and all questions answered.  #Onychomycosis with pain  -Nails palliatively debrided as below. -Educated on self-care  Procedure: Nail Debridement Rationale: Pain Type of Debridement: manual, sharp debridement. Instrumentation: Nail nipper, rotary burr. Number of Nails: 10  Return in about 3 months (around 10/25/2022) for [facility].         Everitt Amber, [name] [facility]

## 2022-09-25 DIAGNOSIS — H2513 Age-related nuclear cataract, bilateral: Secondary | ICD-10-CM | POA: Diagnosis not present

## 2022-10-15 ENCOUNTER — Other Ambulatory Visit (HOSPITAL_COMMUNITY): Payer: Self-pay | Admitting: *Deleted

## 2022-10-15 MED ORDER — POTASSIUM CHLORIDE ER 10 MEQ PO TBCR: 20.0000 meq | EXTENDED_RELEASE_TABLET | Freq: Every day | ORAL | 1 refills | Status: DC

## 2022-10-15 MED ORDER — APIXABAN 5 MG PO TABS: 5.0000 mg | ORAL_TABLET | Freq: Two times a day (BID) | ORAL | 1 refills | Status: AC

## 2022-12-04 ENCOUNTER — Encounter: Payer: Self-pay | Admitting: Podiatry

## 2022-12-04 ENCOUNTER — Other Ambulatory Visit (HOSPITAL_COMMUNITY): Payer: Self-pay | Admitting: *Deleted

## 2022-12-04 ENCOUNTER — Ambulatory Visit (INDEPENDENT_AMBULATORY_CARE_PROVIDER_SITE_OTHER): Payer: Medicare HMO | Admitting: Podiatry

## 2022-12-04 DIAGNOSIS — M79674 Pain in right toe(s): Secondary | ICD-10-CM | POA: Diagnosis not present

## 2022-12-04 DIAGNOSIS — E1142 Type 2 diabetes mellitus with diabetic polyneuropathy: Secondary | ICD-10-CM | POA: Diagnosis not present

## 2022-12-04 DIAGNOSIS — M2012 Hallux valgus (acquired), left foot: Secondary | ICD-10-CM | POA: Diagnosis not present

## 2022-12-04 DIAGNOSIS — M2142 Flat foot [pes planus] (acquired), left foot: Secondary | ICD-10-CM

## 2022-12-04 DIAGNOSIS — M2011 Hallux valgus (acquired), right foot: Secondary | ICD-10-CM | POA: Diagnosis not present

## 2022-12-04 DIAGNOSIS — B351 Tinea unguium: Secondary | ICD-10-CM

## 2022-12-04 DIAGNOSIS — E119 Type 2 diabetes mellitus without complications: Secondary | ICD-10-CM

## 2022-12-04 DIAGNOSIS — B353 Tinea pedis: Secondary | ICD-10-CM

## 2022-12-04 DIAGNOSIS — M79675 Pain in left toe(s): Secondary | ICD-10-CM

## 2022-12-04 DIAGNOSIS — M2141 Flat foot [pes planus] (acquired), right foot: Secondary | ICD-10-CM | POA: Diagnosis not present

## 2022-12-04 MED ORDER — POTASSIUM CHLORIDE ER 10 MEQ PO TBCR: 20.0000 meq | EXTENDED_RELEASE_TABLET | Freq: Every day | ORAL | 0 refills | Status: DC

## 2022-12-04 MED ORDER — KETOCONAZOLE 2 % EX CREA: TOPICAL_CREAM | CUTANEOUS | 2 refills | Status: DC

## 2022-12-06 ENCOUNTER — Other Ambulatory Visit (HOSPITAL_COMMUNITY): Payer: Self-pay

## 2022-12-06 MED ORDER — POTASSIUM CHLORIDE ER 10 MEQ PO TBCR: 20.0000 meq | EXTENDED_RELEASE_TABLET | Freq: Every day | ORAL | 0 refills | Status: DC

## 2022-12-14 NOTE — Progress Notes (Signed)
ANNUAL DIABETIC FOOT EXAM  Subjective: Stepahnie Lawson presents today annual diabetic foot exam.  Chief Complaint  Patient presents with   Diabetes    Beth Lawson BS - 92 A1C - DK  LVPCP - 05/2022   Patient confirms h/o diabetes.  Patient has h/o foot/leg ulcer of right lower extremity.  Patient has been diagnosed with neuropathy.  Risk factors: diabetes, diabetic neuropathy, history of foot/leg ulcer, PAD, lymphedema, HTN, CKD, hyperlipidemia.  Beth Pilon, FNP is patient's PCP.  Past Medical History:  Diagnosis Date   Diabetes mellitus, type 2 (HCC)    HTN (hypertension)    Hyperlipidemia    Hypothyroidism    Seizures (HCC)    Patient Active Problem List   Diagnosis Date Noted   Chronic kidney disease, stage 3a (HCC) 04/10/2021   Hypoparathyroidism (HCC) 04/10/2021   Paroxysmal atrial fibrillation (HCC) 04/10/2021   Skin ulcer (HCC) 07/11/2020   Bilateral lower extremity edema 04/14/2020   Body mass index (BMI) 40.0-44.9, adult (HCC) 04/14/2020   Cardiac murmur, unspecified 04/14/2020   Diabetic peripheral neuropathy associated with type 2 diabetes mellitus (HCC) 04/14/2020   Essential hypertension 04/14/2020   Hypothyroid 04/14/2020   Mixed hyperlipidemia 04/14/2020   Morbid obesity (HCC) 04/14/2020   Postoperative hypothyroidism 04/14/2020   Seizure disorder (HCC) 04/14/2020   Urinary incontinence 04/14/2020   Past Surgical History:  Procedure Laterality Date   ABDOMINAL HYSTERECTOMY     BREAST SURGERY     THYROIDECTOMY  1988   Current Outpatient Medications on File Prior to Visit  Medication Sig Dispense Refill   Alcohol Swabs (B-D SINGLE USE SWABS REGULAR) PADS See admin instructions.     amLODipine (NORVASC) 10 MG tablet Take 10 mg by mouth daily.     apixaban (ELIQUIS) 5 MG TABS tablet Take 1 tablet (5 mg total) by mouth 2 (two) times daily. 180 tablet 1   atorvastatin (LIPITOR) 80 MG tablet Take 80 mg by mouth daily.     azelastine (OPTIVAR) 0.05 %  ophthalmic solution Place 1 drop into both eyes daily.     Blood Glucose Calibration (TRUE METRIX LEVEL 1) Low SOLN      Blood Glucose Monitoring Suppl (TRUE METRIX METER) w/Device KIT      Calcium Carbonate-Vit D-Min (CALTRATE 600+D PLUS MINERALS) 600-800 MG-UNIT TABS Take 1 tablet by mouth in the morning and at bedtime.     ezetimibe (ZETIA) 10 MG tablet Take 10 mg by mouth daily.     furosemide (LASIX) 40 MG tablet Take 40 mg by mouth daily.     glucose blood (TRUE METRIX BLOOD GLUCOSE TEST) test strip Use to test your blood sugar     levETIRAcetam (KEPPRA) 500 MG tablet Take 500 mg by mouth 2 (two) times daily.     levothyroxine (SYNTHROID) 175 MCG tablet Take 175 mcg by mouth daily before breakfast.     losartan-hydrochlorothiazide (HYZAAR) 50-12.5 MG tablet Take 1 tablet by mouth daily.     metFORMIN (GLUCOPHAGE) 500 MG tablet Take 1 tablet by mouth 2 (two) times daily with a meal.     metoprolol succinate (TOPROL-XL) 100 MG 24 hr tablet Take 100 mg by mouth daily.     Multiple Vitamins-Minerals (CENTRUM SILVER 50+WOMEN) TABS Take 1 tablet by mouth daily.     TRUEplus Lancets 33G MISC Use to test your blood sugar     No current facility-administered medications on file prior to visit.    No Known Allergies Social History   Occupational History  Not on file  Tobacco Use   Smoking status: Never   Smokeless tobacco: Never  Substance and Sexual Activity   Alcohol use: Never   Drug use: Never   Sexual activity: Not on file   History reviewed. No pertinent family history. Immunization History  Administered Date(s) Administered   Influenza, High Dose Seasonal PF 03/22/2019, 03/14/2020   PFIZER(Purple Top)SARS-COV-2 Vaccination 07/17/2019, 08/07/2019   Zoster, Live 03/22/2019, 05/24/2019     Review of Systems: Negative except as noted in the HPI.   Objective: There were no vitals filed for this visit.  [name] is a pleasant 82 y.o. female in NAD. AAO X 3.  Vascular  Examination: CFT <3 seconds b/l LE. Faintly palpable DP pulses b/l LE. Faintly palpable PT pulse(s) b/l LE. Pedal hair absent. Lower extremity skin temperature gradient within normal limits. Nonpitting edema noted BLE. Evidence of chronic venous insufficiency b/l LE. No ischemia or gangrene noted b/l LE. No cyanosis or clubbing noted b/l LE.  Dermatological Examination: Skin warm and supple b/l feet. Scarring from chronic venous insufficiency b/l LE. No interdigital macerations noted b/l LE. Toenails 1-5 b/l elongated, discolored, dystrophic, thickened, crumbly with subungual debris and tenderness to dorsal palpation.  Diffuse scaling noted peripherally and plantarly b/l feet.  No interdigital macerations.  No blisters, no weeping. No signs of secondary bacterial infection noted.  Musculoskeletal Examination: Muscle strength 5/5 to all lower extremity muscle groups bilaterally. HAV with bunion deformity noted b/l LE. Hammertoe deformity noted 2-5 b/l. Utilizes rollator for ambulation assistance.  Neurological Examination: Pt has subjective symptoms of neuropathy. Protective sensation intact 5/5 intact bilaterally with 10g monofilament b/l.  Lab Results  Component Value Date   HGBA1C 6.0 08/21/2017   ADA Risk Categorization: High Risk  Patient has one or more of the following: Loss of protective sensation Absent pedal pulses Severe Foot deformity History of foot ulcer  Assessment: 1. Pain due to onychomycosis of toenails of both feet   2. Hallux valgus, acquired, bilateral   3. Tinea pedis of both feet   4. Pes planus of both feet   5. Diabetic peripheral neuropathy associated with type 2 diabetes mellitus (HCC)   6. Encounter for diabetic foot exam (HCC)     Plan: Meds ordered this encounter  Medications   ketoconazole (NIZORAL) 2 % cream    Sig: Apply to both feet and between toes once daily until scales are gone.    Dispense:  60 g    Refill:  2   -Patient was evaluated  and treated. All patient's and/or POA's questions/concerns answered on today's visit. -Diabetic foot examination performed today. -Continue diabetic foot care principles: inspect feet daily, monitor glucose as recommended by PCP and/or Endocrinologist, and follow prescribed diet per PCP, Endocrinologist and/or dietician. -Patient to continue soft, supportive shoe gear daily. -Mycotic toenails 1-5 bilaterally were debrided in length and girth with sterile nail nippers and dremel without incident. -Discussed tinea pedis infection. To prevent re-infection of tinea pedis, patient/POA/caregiver instructed to spray shoes with Lysol every evening and clean tub/shower with bleach based cleanser. -For tinea pedis, Rx sent to pharmacy for Ketoconazole Cream 2% to be applied once daily for six weeks. -Patient/POA to call should there be question/concern in the interim. Return in about 3 months (around 03/06/2023).  [name], [name]

## 2022-12-18 DIAGNOSIS — E782 Mixed hyperlipidemia: Secondary | ICD-10-CM | POA: Diagnosis not present

## 2022-12-18 DIAGNOSIS — D6869 Other thrombophilia: Secondary | ICD-10-CM | POA: Diagnosis not present

## 2022-12-18 DIAGNOSIS — E1140 Type 2 diabetes mellitus with diabetic neuropathy, unspecified: Secondary | ICD-10-CM | POA: Diagnosis not present

## 2022-12-18 DIAGNOSIS — N1831 Chronic kidney disease, stage 3a: Secondary | ICD-10-CM | POA: Diagnosis not present

## 2022-12-18 DIAGNOSIS — E890 Postprocedural hypothyroidism: Secondary | ICD-10-CM | POA: Diagnosis not present

## 2022-12-18 DIAGNOSIS — I10 Essential (primary) hypertension: Secondary | ICD-10-CM | POA: Diagnosis not present

## 2022-12-18 DIAGNOSIS — E1122 Type 2 diabetes mellitus with diabetic chronic kidney disease: Secondary | ICD-10-CM | POA: Diagnosis not present

## 2022-12-18 DIAGNOSIS — S81801D Unspecified open wound, right lower leg, subsequent encounter: Secondary | ICD-10-CM | POA: Diagnosis not present

## 2022-12-18 LAB — COMPREHENSIVE METABOLIC PANEL: EGFR: 63

## 2022-12-25 DIAGNOSIS — Z7984 Long term (current) use of oral hypoglycemic drugs: Secondary | ICD-10-CM | POA: Diagnosis not present

## 2022-12-25 DIAGNOSIS — N1831 Chronic kidney disease, stage 3a: Secondary | ICD-10-CM | POA: Diagnosis not present

## 2022-12-25 DIAGNOSIS — E782 Mixed hyperlipidemia: Secondary | ICD-10-CM | POA: Diagnosis not present

## 2022-12-25 DIAGNOSIS — E1122 Type 2 diabetes mellitus with diabetic chronic kidney disease: Secondary | ICD-10-CM | POA: Diagnosis not present

## 2022-12-25 DIAGNOSIS — I872 Venous insufficiency (chronic) (peripheral): Secondary | ICD-10-CM | POA: Diagnosis not present

## 2022-12-25 DIAGNOSIS — Z556 Problems related to health literacy: Secondary | ICD-10-CM | POA: Diagnosis not present

## 2022-12-25 DIAGNOSIS — E1140 Type 2 diabetes mellitus with diabetic neuropathy, unspecified: Secondary | ICD-10-CM | POA: Diagnosis not present

## 2022-12-25 DIAGNOSIS — I129 Hypertensive chronic kidney disease with stage 1 through stage 4 chronic kidney disease, or unspecified chronic kidney disease: Secondary | ICD-10-CM | POA: Diagnosis not present

## 2023-01-01 DIAGNOSIS — L97509 Non-pressure chronic ulcer of other part of unspecified foot with unspecified severity: Secondary | ICD-10-CM | POA: Diagnosis not present

## 2023-01-03 DIAGNOSIS — N1831 Chronic kidney disease, stage 3a: Secondary | ICD-10-CM | POA: Diagnosis not present

## 2023-01-03 DIAGNOSIS — I129 Hypertensive chronic kidney disease with stage 1 through stage 4 chronic kidney disease, or unspecified chronic kidney disease: Secondary | ICD-10-CM | POA: Diagnosis not present

## 2023-01-03 DIAGNOSIS — E782 Mixed hyperlipidemia: Secondary | ICD-10-CM | POA: Diagnosis not present

## 2023-01-03 DIAGNOSIS — Z556 Problems related to health literacy: Secondary | ICD-10-CM | POA: Diagnosis not present

## 2023-01-03 DIAGNOSIS — Z7984 Long term (current) use of oral hypoglycemic drugs: Secondary | ICD-10-CM | POA: Diagnosis not present

## 2023-01-03 DIAGNOSIS — E1122 Type 2 diabetes mellitus with diabetic chronic kidney disease: Secondary | ICD-10-CM | POA: Diagnosis not present

## 2023-01-03 DIAGNOSIS — E1140 Type 2 diabetes mellitus with diabetic neuropathy, unspecified: Secondary | ICD-10-CM | POA: Diagnosis not present

## 2023-01-03 DIAGNOSIS — I872 Venous insufficiency (chronic) (peripheral): Secondary | ICD-10-CM | POA: Diagnosis not present

## 2023-01-06 DIAGNOSIS — I129 Hypertensive chronic kidney disease with stage 1 through stage 4 chronic kidney disease, or unspecified chronic kidney disease: Secondary | ICD-10-CM | POA: Diagnosis not present

## 2023-01-06 DIAGNOSIS — E782 Mixed hyperlipidemia: Secondary | ICD-10-CM | POA: Diagnosis not present

## 2023-01-06 DIAGNOSIS — Z7984 Long term (current) use of oral hypoglycemic drugs: Secondary | ICD-10-CM | POA: Diagnosis not present

## 2023-01-06 DIAGNOSIS — E1122 Type 2 diabetes mellitus with diabetic chronic kidney disease: Secondary | ICD-10-CM | POA: Diagnosis not present

## 2023-01-06 DIAGNOSIS — E1140 Type 2 diabetes mellitus with diabetic neuropathy, unspecified: Secondary | ICD-10-CM | POA: Diagnosis not present

## 2023-01-06 DIAGNOSIS — I872 Venous insufficiency (chronic) (peripheral): Secondary | ICD-10-CM | POA: Diagnosis not present

## 2023-01-06 DIAGNOSIS — Z556 Problems related to health literacy: Secondary | ICD-10-CM | POA: Diagnosis not present

## 2023-01-06 DIAGNOSIS — N1831 Chronic kidney disease, stage 3a: Secondary | ICD-10-CM | POA: Diagnosis not present

## 2023-01-13 DIAGNOSIS — E782 Mixed hyperlipidemia: Secondary | ICD-10-CM | POA: Diagnosis not present

## 2023-01-13 DIAGNOSIS — I872 Venous insufficiency (chronic) (peripheral): Secondary | ICD-10-CM | POA: Diagnosis not present

## 2023-01-13 DIAGNOSIS — Z556 Problems related to health literacy: Secondary | ICD-10-CM | POA: Diagnosis not present

## 2023-01-13 DIAGNOSIS — E1122 Type 2 diabetes mellitus with diabetic chronic kidney disease: Secondary | ICD-10-CM | POA: Diagnosis not present

## 2023-01-13 DIAGNOSIS — I129 Hypertensive chronic kidney disease with stage 1 through stage 4 chronic kidney disease, or unspecified chronic kidney disease: Secondary | ICD-10-CM | POA: Diagnosis not present

## 2023-01-13 DIAGNOSIS — N1831 Chronic kidney disease, stage 3a: Secondary | ICD-10-CM | POA: Diagnosis not present

## 2023-01-13 DIAGNOSIS — Z7984 Long term (current) use of oral hypoglycemic drugs: Secondary | ICD-10-CM | POA: Diagnosis not present

## 2023-01-13 DIAGNOSIS — E1140 Type 2 diabetes mellitus with diabetic neuropathy, unspecified: Secondary | ICD-10-CM | POA: Diagnosis not present

## 2023-01-16 DIAGNOSIS — Z556 Problems related to health literacy: Secondary | ICD-10-CM | POA: Diagnosis not present

## 2023-01-16 DIAGNOSIS — E782 Mixed hyperlipidemia: Secondary | ICD-10-CM | POA: Diagnosis not present

## 2023-01-16 DIAGNOSIS — E1122 Type 2 diabetes mellitus with diabetic chronic kidney disease: Secondary | ICD-10-CM | POA: Diagnosis not present

## 2023-01-16 DIAGNOSIS — I129 Hypertensive chronic kidney disease with stage 1 through stage 4 chronic kidney disease, or unspecified chronic kidney disease: Secondary | ICD-10-CM | POA: Diagnosis not present

## 2023-01-16 DIAGNOSIS — N1831 Chronic kidney disease, stage 3a: Secondary | ICD-10-CM | POA: Diagnosis not present

## 2023-01-16 DIAGNOSIS — I872 Venous insufficiency (chronic) (peripheral): Secondary | ICD-10-CM | POA: Diagnosis not present

## 2023-01-16 DIAGNOSIS — Z7984 Long term (current) use of oral hypoglycemic drugs: Secondary | ICD-10-CM | POA: Diagnosis not present

## 2023-01-16 DIAGNOSIS — E1140 Type 2 diabetes mellitus with diabetic neuropathy, unspecified: Secondary | ICD-10-CM | POA: Diagnosis not present

## 2023-01-16 IMAGING — DX DG CHEST 1V PORT
1 series · 1 of 1 positions shown · non-contrast
Comparison: None.

CLINICAL DATA: Chest pain

EXAM:
PORTABLE CHEST 1 VIEW

[chest ap]
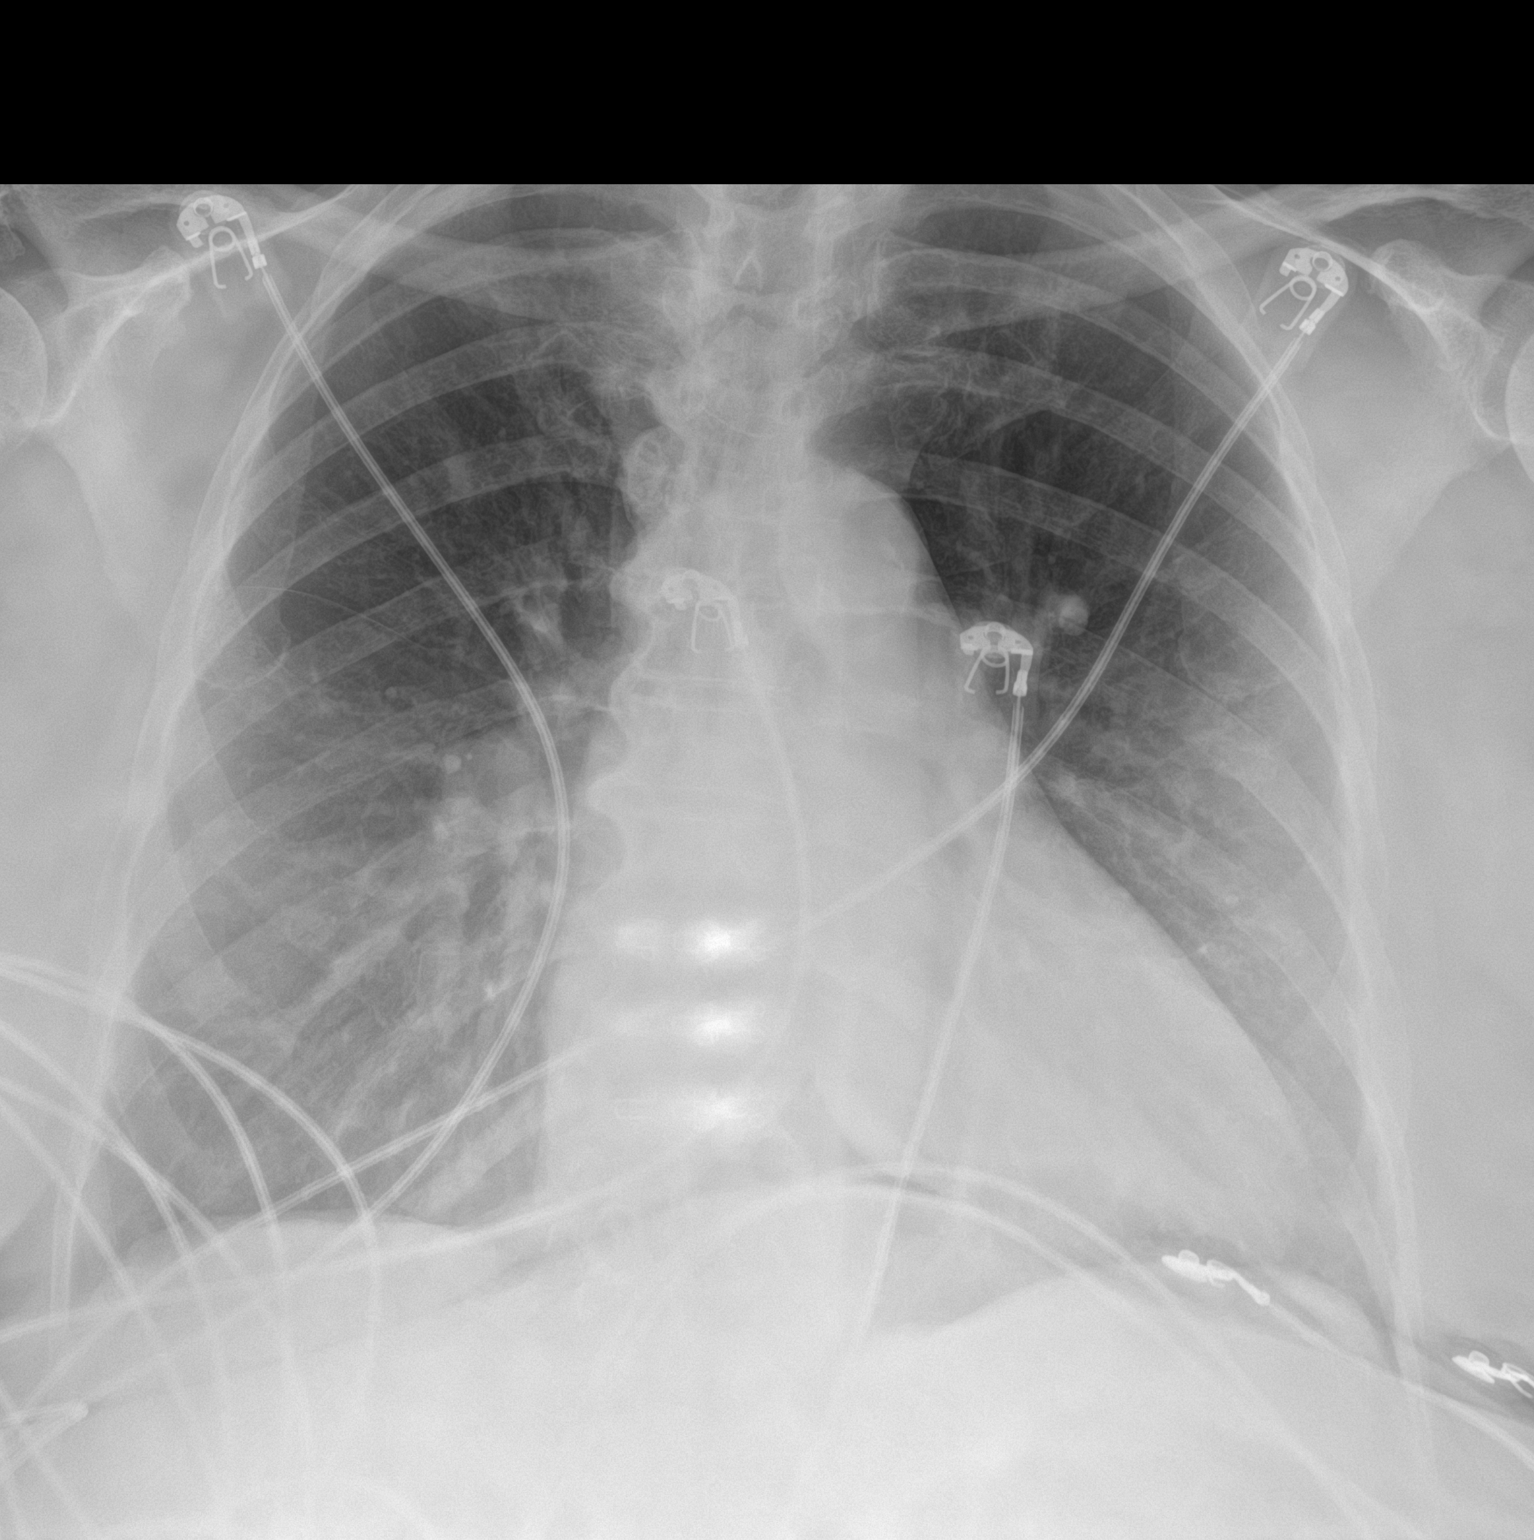

[1 of 1 positions shown; findings below may reference images not displayed]

FINDINGS: No focal opacity or pleural effusion. Borderline to mild
cardiomegaly. No pneumothorax.
IMPRESSION: No active disease.  Borderline to mild cardiomegaly

## 2023-01-23 DIAGNOSIS — N1831 Chronic kidney disease, stage 3a: Secondary | ICD-10-CM | POA: Diagnosis not present

## 2023-01-23 DIAGNOSIS — Z556 Problems related to health literacy: Secondary | ICD-10-CM | POA: Diagnosis not present

## 2023-01-23 DIAGNOSIS — I129 Hypertensive chronic kidney disease with stage 1 through stage 4 chronic kidney disease, or unspecified chronic kidney disease: Secondary | ICD-10-CM | POA: Diagnosis not present

## 2023-01-23 DIAGNOSIS — I872 Venous insufficiency (chronic) (peripheral): Secondary | ICD-10-CM | POA: Diagnosis not present

## 2023-01-23 DIAGNOSIS — Z7984 Long term (current) use of oral hypoglycemic drugs: Secondary | ICD-10-CM | POA: Diagnosis not present

## 2023-01-23 DIAGNOSIS — E1122 Type 2 diabetes mellitus with diabetic chronic kidney disease: Secondary | ICD-10-CM | POA: Diagnosis not present

## 2023-01-23 DIAGNOSIS — E1140 Type 2 diabetes mellitus with diabetic neuropathy, unspecified: Secondary | ICD-10-CM | POA: Diagnosis not present

## 2023-01-23 DIAGNOSIS — E782 Mixed hyperlipidemia: Secondary | ICD-10-CM | POA: Diagnosis not present

## 2023-01-27 ENCOUNTER — Ambulatory Visit (HOSPITAL_COMMUNITY): Payer: Medicare HMO | Admitting: Internal Medicine

## 2023-01-28 ENCOUNTER — Ambulatory Visit (HOSPITAL_COMMUNITY): Payer: Medicare HMO | Admitting: Internal Medicine

## 2023-01-31 DIAGNOSIS — I129 Hypertensive chronic kidney disease with stage 1 through stage 4 chronic kidney disease, or unspecified chronic kidney disease: Secondary | ICD-10-CM | POA: Diagnosis not present

## 2023-01-31 DIAGNOSIS — E1140 Type 2 diabetes mellitus with diabetic neuropathy, unspecified: Secondary | ICD-10-CM | POA: Diagnosis not present

## 2023-01-31 DIAGNOSIS — E1122 Type 2 diabetes mellitus with diabetic chronic kidney disease: Secondary | ICD-10-CM | POA: Diagnosis not present

## 2023-01-31 DIAGNOSIS — Z7984 Long term (current) use of oral hypoglycemic drugs: Secondary | ICD-10-CM | POA: Diagnosis not present

## 2023-01-31 DIAGNOSIS — E782 Mixed hyperlipidemia: Secondary | ICD-10-CM | POA: Diagnosis not present

## 2023-01-31 DIAGNOSIS — I872 Venous insufficiency (chronic) (peripheral): Secondary | ICD-10-CM | POA: Diagnosis not present

## 2023-01-31 DIAGNOSIS — Z556 Problems related to health literacy: Secondary | ICD-10-CM | POA: Diagnosis not present

## 2023-01-31 DIAGNOSIS — N1831 Chronic kidney disease, stage 3a: Secondary | ICD-10-CM | POA: Diagnosis not present

## 2023-02-07 DIAGNOSIS — E1140 Type 2 diabetes mellitus with diabetic neuropathy, unspecified: Secondary | ICD-10-CM | POA: Diagnosis not present

## 2023-02-07 DIAGNOSIS — N1831 Chronic kidney disease, stage 3a: Secondary | ICD-10-CM | POA: Diagnosis not present

## 2023-02-07 DIAGNOSIS — E1122 Type 2 diabetes mellitus with diabetic chronic kidney disease: Secondary | ICD-10-CM | POA: Diagnosis not present

## 2023-02-07 DIAGNOSIS — Z556 Problems related to health literacy: Secondary | ICD-10-CM | POA: Diagnosis not present

## 2023-02-07 DIAGNOSIS — I129 Hypertensive chronic kidney disease with stage 1 through stage 4 chronic kidney disease, or unspecified chronic kidney disease: Secondary | ICD-10-CM | POA: Diagnosis not present

## 2023-02-07 DIAGNOSIS — E782 Mixed hyperlipidemia: Secondary | ICD-10-CM | POA: Diagnosis not present

## 2023-02-07 DIAGNOSIS — Z7984 Long term (current) use of oral hypoglycemic drugs: Secondary | ICD-10-CM | POA: Diagnosis not present

## 2023-02-07 DIAGNOSIS — I872 Venous insufficiency (chronic) (peripheral): Secondary | ICD-10-CM | POA: Diagnosis not present

## 2023-02-11 ENCOUNTER — Inpatient Hospital Stay (HOSPITAL_COMMUNITY): Admission: RE | Admit: 2023-02-11 | Payer: Medicare HMO | Source: Ambulatory Visit | Admitting: Internal Medicine

## 2023-03-05 ENCOUNTER — Other Ambulatory Visit (HOSPITAL_COMMUNITY): Payer: Self-pay | Admitting: Internal Medicine

## 2023-03-10 ENCOUNTER — Ambulatory Visit: Payer: Medicare HMO | Admitting: Podiatry

## 2023-03-10 ENCOUNTER — Ambulatory Visit (INDEPENDENT_AMBULATORY_CARE_PROVIDER_SITE_OTHER): Payer: Medicare HMO | Admitting: Podiatry

## 2023-03-10 ENCOUNTER — Ambulatory Visit (HOSPITAL_COMMUNITY)
Admission: RE | Admit: 2023-03-10 | Discharge: 2023-03-10 | Disposition: A | Discharge status: Home / Self Care | Payer: Medicare HMO | Source: Ambulatory Visit | Attending: Internal Medicine | Admitting: Internal Medicine

## 2023-03-10 VITALS — BP 144/68 | HR 79 | Ht 68.0 in | Wt 230.0 lb

## 2023-03-10 DIAGNOSIS — B351 Tinea unguium: Secondary | ICD-10-CM | POA: Diagnosis not present

## 2023-03-10 DIAGNOSIS — Z79899 Other long term (current) drug therapy: Secondary | ICD-10-CM | POA: Diagnosis not present

## 2023-03-10 DIAGNOSIS — Z7984 Long term (current) use of oral hypoglycemic drugs: Secondary | ICD-10-CM | POA: Diagnosis not present

## 2023-03-10 DIAGNOSIS — E119 Type 2 diabetes mellitus without complications: Secondary | ICD-10-CM | POA: Diagnosis not present

## 2023-03-10 DIAGNOSIS — D6869 Other thrombophilia: Secondary | ICD-10-CM | POA: Diagnosis not present

## 2023-03-10 DIAGNOSIS — M79674 Pain in right toe(s): Secondary | ICD-10-CM

## 2023-03-10 DIAGNOSIS — M79675 Pain in left toe(s): Secondary | ICD-10-CM | POA: Diagnosis not present

## 2023-03-10 DIAGNOSIS — I480 Paroxysmal atrial fibrillation: Secondary | ICD-10-CM | POA: Diagnosis not present

## 2023-03-10 DIAGNOSIS — E039 Hypothyroidism, unspecified: Secondary | ICD-10-CM | POA: Insufficient documentation

## 2023-03-10 DIAGNOSIS — E785 Hyperlipidemia, unspecified: Secondary | ICD-10-CM | POA: Diagnosis not present

## 2023-03-10 DIAGNOSIS — Z7901 Long term (current) use of anticoagulants: Secondary | ICD-10-CM | POA: Insufficient documentation

## 2023-03-10 NOTE — Progress Notes (Unsigned)
       Subjective:  Patient ID: Beth Lawson, female    DOB: 05-20-1940,  MRN: [id number]  Beth Lawson presents to clinic today for:  Chief Complaint  Patient presents with   [facility]    [facility]   Patient notes nails are thick and elongated, causing pain in shoe gear when ambulating.  She acknowledges some swelling in her legs and top of her feet.  She is wearing house slippers today and using a walker to assist with ambulation.  Notes that she does not drive due to history of seizure.  Her sister drove her to her appointment but is not in the exam room today.  She typically sees Dr. Donzetta Matters for care.  PCP is [name], FNP.  Past Medical History:  Diagnosis Date   Diabetes mellitus, type 2 (HCC)    HTN (hypertension)    Hyperlipidemia    Hypothyroidism    Seizures (HCC)     No Known Allergies  Objective:  Beth Lawson is a pleasant 82 y.o. female in NAD. AAO x 3.  Vascular Examination: Patient has palpable DP pulse, absent PT pulse bilateral.  Delayed capillary refill bilateral toes.  Sparse digital hair bilateral.  Proximal to distal cooling WNL bilateral.  +1 pitting edema to the bilateral lower legs and dorsal foot.  Dermatological Examination: Interspaces are clear with no open lesions noted bilateral.  Skin is shiny and atrophic bilateral.  Nails are 3-75mm thick, with yellowish/brown discoloration, subungual debris and distal onycholysis x10.  There is pain with compression of nails x10.  Patient qualifies for at-risk foot care because of diabetes with PVD.  Assessment/Plan: 1. Pain due to onychomycosis of toenails of both feet    Mycotic nails x10 were sharply debrided with sterile nail nippers and power debriding burr to decrease bulk and length.  Return for already has next appt in 3 months w/ Dr. Nanetta Batty, [name], FACFAS [facility]     [address] [place], [place] [address]                 Office [phone]  Fax [phone]

## 2023-03-10 NOTE — Progress Notes (Addendum)
Primary Care Physician: [name], FNP Referring Physician: ER MD   Beth Lawson is a 82 y.o. female with a h/o of T2DM, hypertension, hyperlipidemia, seizures & hypothyroidism who presented to the ED 10/11/20 via EMS with complaints of chest discomfort that began shortly prior to arrival, but shortly resolved. Pt felt she got overheated and that brought on the afib. This is a new diagnosis. She was continued on her metoprolol and started on eliquis 5 mg bid for a CHA2DS2VASc score of at least 5. She is in SR today and has not noted any further afib.   F/u in afib clinic, 12/07/20. Echo done this am. Has been on anticoagulation for new onset afib noted in ER in June. Has done well with out any bleeding issues. Ekg shows SR and she has not noted any irregular HR.   F/u in afib clinic,12/26/21.  Pt is in SR. No noted irregular HR.  No voiced complaints today. No issues with anticoagulation. Her husband that was in a SNF passed away last month.   F/u in Afib clinic, 03/10/23. Patient is currently in NSR. She has had no episodes of Afib since last office visit. No bleeding issues on Eliquis 5 mg BID. She is upset at the price increase on the Eliquis prescription.   Today, she denies symptoms of palpitations, chest pain, shortness of breath, orthopnea, PND, lower extremity edema, dizziness, presyncope, syncope, or neurologic sequela. The patient is tolerating medications without difficulties and is otherwise without complaint today.   Past Medical History:  Diagnosis Date   Diabetes mellitus, type 2 (HCC)    HTN (hypertension)    Hyperlipidemia    Hypothyroidism    Seizures (HCC)    Past Surgical History:  Procedure Laterality Date   ABDOMINAL HYSTERECTOMY     BREAST SURGERY     THYROIDECTOMY  1988    Current Outpatient Medications  Medication Sig Dispense Refill   Alcohol Swabs (B-D SINGLE USE SWABS REGULAR) PADS See admin instructions.     amLODipine (NORVASC) 10 MG tablet Take  10 mg by mouth daily.     apixaban (ELIQUIS) 5 MG TABS tablet Take 1 tablet (5 mg total) by mouth 2 (two) times daily. 180 tablet 1   atorvastatin (LIPITOR) 80 MG tablet Take 80 mg by mouth daily.     Blood Glucose Calibration (TRUE METRIX LEVEL 1) Low SOLN      Blood Glucose Monitoring Suppl (TRUE METRIX METER) w/Device KIT      Calcium Carbonate-Vit D-Min (CALTRATE 600+D PLUS MINERALS) 600-800 MG-UNIT TABS Take 1 tablet by mouth in the morning and at bedtime.     ezetimibe (ZETIA) 10 MG tablet Take 10 mg by mouth daily.     furosemide (LASIX) 40 MG tablet Take 40 mg by mouth daily.     glucose blood (TRUE METRIX BLOOD GLUCOSE TEST) test strip Use to test your blood sugar     Glycerin-Polysorbate 80 (REFRESH DRY EYE THERAPY OP) Apply 1 drop to eye daily.     ketoconazole (NIZORAL) 2 % cream Apply to both feet and between toes once daily until scales are gone. 60 g 2   levETIRAcetam (KEPPRA) 500 MG tablet Take 500 mg by mouth 2 (two) times daily.     levothyroxine (SYNTHROID) 175 MCG tablet Take 175 mcg by mouth daily before breakfast.     losartan-hydrochlorothiazide (HYZAAR) 50-12.5 MG tablet Take 1 tablet by mouth daily.     metFORMIN (GLUCOPHAGE) 500 MG tablet Take 1  tablet by mouth 2 (two) times daily with a meal.     metoprolol succinate (TOPROL-XL) 100 MG 24 hr tablet Take 100 mg by mouth daily.     Multiple Vitamins-Minerals (CENTRUM SILVER 50+WOMEN) TABS Take 1 tablet by mouth daily.     potassium chloride (KLOR-CON) 10 MEQ tablet Take 2 tablets (20 mEq total) by mouth daily. 180 tablet 1   triamcinolone cream (KENALOG) 0.1 % Apply 1 Application topically 2 (two) times daily.     TRUEplus Lancets 33G MISC Use to test your blood sugar     No current facility-administered medications for this encounter.    No Known Allergies  ROS- All systems are reviewed and negative except as per the HPI above  Physical Exam: Vitals:   03/10/23 1312  BP: (!) 144/68  Pulse: 79  Weight: 104.3  kg  Height: 5\' 8"  (1.727 m)    Wt Readings from Last 3 Encounters:  03/10/23 104.3 kg  12/26/21 104.6 kg  12/07/20 115 kg    Labs: Lab Results  Component Value Date   NA 141 12/07/2020   K 3.1 (L) 12/07/2020   CL 104 12/07/2020   CO2 29 12/07/2020   GLUCOSE 115 (H) 12/07/2020   BUN 24 (H) 12/07/2020   CREATININE 1.07 (H) 12/07/2020   CALCIUM 8.9 12/07/2020   No results found for: "INR" No results found for: "CHOL", "HDL", "LDLCALC", "TRIG"  GEN- The patient is well appearing, alert and oriented x 3 today.   Neck - no JVD or carotid bruit noted Lungs- Clear to ausculation bilaterally, normal work of breathing Heart- Regular rate and rhythm, no murmurs, rubs or gallops, PMI not laterally displaced Extremities- no clubbing, cyanosis, or edema Skin - no rash or ecchymosis noted  EKG- Vent. rate 79 BPM PR interval 198 ms QRS duration 72 ms QT/QTcB 366/419 ms P-R-T axes 90 65 39 Sinus rhythm with occasional Premature ventricular complexes Low voltage QRS Borderline ECG When compared with ECG of 26-Dec-2021 12:02, PREVIOUS ECG IS PRESENT   Echo 01/28/22-  1. Left ventricular ejection fraction, by estimation, is 65 to 70%. Left  ventricular ejection fraction by 3D volume is 67 %. The left ventricle has  normal function. The left ventricle has no regional wall motion  abnormalities. There is mild left  ventricular hypertrophy. Left ventricular diastolic parameters are  consistent with Grade I diastolic dysfunction (impaired relaxation).   2. Right ventricular systolic function is normal. The right ventricular  size is normal. There is mildly elevated pulmonary artery systolic  pressure. The estimated right ventricular systolic pressure is 36.4 mmHg.   3. Left atrial size was mildly dilated.   4. The mitral valve is grossly normal. Trivial mitral valve  regurgitation.   5. The aortic valve is tricuspid. Aortic valve regurgitation is not  visualized.   6. The inferior  vena cava is normal in size with greater than 50%  respiratory variability, suggesting right atrial pressure of 3 mmHg.   Comparison(s): Changes from prior study are noted. 12/07/2020: LVEF 60-65%,  grade 2 DD, elevated LVEDP.    Assessment and Plan:  1. New onset afib 10/11/20 with ER visit  Pt not aware of any further afib  In SR today  Continue metoprolol succinate 100 mg qd  2. CHA2DS2VASc score of 5 No bleeding noted  Continue eliquis 5 mg bid. We discussed other options such as coumadin but patient is against frequent lab draws.   Lab work from PCP shows creatinine of 0.91  on 11/2022. Slight anemia noted on CBC from 05/2022 with Hgb of 11.6.    F/u in one year.   Justin Mend, PA-C  [facility] [address] [place], [place] [address] [phone]

## 2023-05-05 ENCOUNTER — Other Ambulatory Visit (HOSPITAL_COMMUNITY): Payer: Self-pay | Admitting: Internal Medicine

## 2023-06-10 ENCOUNTER — Ambulatory Visit (INDEPENDENT_AMBULATORY_CARE_PROVIDER_SITE_OTHER): Payer: Medicare HMO | Admitting: Podiatry

## 2023-06-10 ENCOUNTER — Encounter: Payer: Self-pay | Admitting: Podiatry

## 2023-06-10 VITALS — Ht 68.0 in | Wt 230.0 lb

## 2023-06-10 DIAGNOSIS — M79675 Pain in left toe(s): Secondary | ICD-10-CM | POA: Diagnosis not present

## 2023-06-10 DIAGNOSIS — B351 Tinea unguium: Secondary | ICD-10-CM

## 2023-06-10 DIAGNOSIS — M79674 Pain in right toe(s): Secondary | ICD-10-CM | POA: Diagnosis not present

## 2023-06-10 DIAGNOSIS — E1142 Type 2 diabetes mellitus with diabetic polyneuropathy: Secondary | ICD-10-CM

## 2023-06-17 NOTE — Progress Notes (Signed)
  Subjective:  Patient ID: Beth Lawson, female    DOB: [birth year],  MRN: [id number]  83 y.o. female presents with at risk foot care with history of diabetic neuropathy and painful, elongated thickened toenails x 10 which are symptomatic when wearing enclosed shoe gear. This interferes with his/her daily activities. Chief Complaint  Patient presents with   DFc    She is here for a nail trim ."Does not remember her last A1C, PCP  is Dr,. Drema Pry,     PCP: [name], FNP.  New problem(s): None.   Review of Systems: Negative except as noted in the HPI.   No Known Allergies  Objective:  There were no vitals filed for this visit. Constitutional Patient is a pleasant 83 y.o. female obese in NAD. AAO x 3.  Vascular Capillary fill time to digits <3 seconds.  DP/PT pulse(s) are faintly palpable b/l lower extremities. Pedal hair absent b/l. Lower extremity skin temperature gradient warm to warm b/l. No pain with calf compression b/l. No cyanosis or clubbing noted. No ischemia nor gangrene noted b/l. Nonpitting edema noted BLE.  Neurologic Protective sensation intact 5/5 intact bilaterally with 10g monofilament b/l. Pt has subjective symptoms of neuropathy.  Dermatologic Pedal skin is thin, shiny and atrophic b/l.  No open wounds b/l lower extremities. No interdigital macerations b/l lower extremities. Toenails 1-5 b/l elongated, discolored, dystrophic, thickened, crumbly with subungual debris and tenderness to dorsal palpation. No corns, calluses nor porokeratotic lesions noted.  Orthopedic: Normal muscle strength 5/5 to all lower extremity muscle groups bilaterally. HAV with bunion bilaterally and hammertoes 2-5 b/l.   Assessment:   1. Pain due to onychomycosis of toenails of both feet   2. Diabetic peripheral neuropathy associated with type 2 diabetes mellitus (HCC)    Plan:  Patient was evaluated and treated. All patient's and/or POA's questions/concerns addressed on today's visit.  Toenails 1-5 debrided in length and girth without incident. Continue foot and shoe inspections daily. Monitor blood glucose per PCP/Endocrinologist's recommendations. Continue soft, supportive shoe gear daily. Report any pedal injuries to medical professional. Call office if there are any questions/concerns. -Patient/POA to call should there be question/concern in the interim.  Return in about 4 months (around 10/08/2023).  [name], [name]      [facility] LOCATION: [address], [place] [address]                   Office [phone]   [facility] LOCATION: [address] [place], [place] [address] Office [phone]

## 2023-06-18 ENCOUNTER — Other Ambulatory Visit (HOSPITAL_COMMUNITY): Payer: Self-pay

## 2023-10-07 ENCOUNTER — Ambulatory Visit: Payer: Medicare HMO | Admitting: Podiatry

## 2023-10-07 ENCOUNTER — Encounter: Payer: Self-pay | Admitting: Podiatry

## 2023-10-07 DIAGNOSIS — E1142 Type 2 diabetes mellitus with diabetic polyneuropathy: Secondary | ICD-10-CM | POA: Diagnosis not present

## 2023-10-07 DIAGNOSIS — B351 Tinea unguium: Secondary | ICD-10-CM | POA: Diagnosis not present

## 2023-10-07 DIAGNOSIS — H2513 Age-related nuclear cataract, bilateral: Secondary | ICD-10-CM | POA: Diagnosis not present

## 2023-10-07 DIAGNOSIS — M79674 Pain in right toe(s): Secondary | ICD-10-CM

## 2023-10-07 DIAGNOSIS — E119 Type 2 diabetes mellitus without complications: Secondary | ICD-10-CM | POA: Diagnosis not present

## 2023-10-07 DIAGNOSIS — H35371 Puckering of macula, right eye: Secondary | ICD-10-CM | POA: Diagnosis not present

## 2023-10-07 DIAGNOSIS — M79675 Pain in left toe(s): Secondary | ICD-10-CM

## 2023-10-07 DIAGNOSIS — H5203 Hypermetropia, bilateral: Secondary | ICD-10-CM | POA: Diagnosis not present

## 2023-10-07 DIAGNOSIS — H35033 Hypertensive retinopathy, bilateral: Secondary | ICD-10-CM | POA: Diagnosis not present

## 2023-10-07 DIAGNOSIS — H11822 Conjunctivochalasis, left eye: Secondary | ICD-10-CM | POA: Diagnosis not present

## 2023-10-07 DIAGNOSIS — H52221 Regular astigmatism, right eye: Secondary | ICD-10-CM | POA: Diagnosis not present

## 2023-10-12 NOTE — Progress Notes (Signed)
  Subjective:  Patient ID: Beth Lawson, female    DOB: [birth year],  MRN: [id number]  83 y.o. female presents to clinic with  at risk foot care with history of diabetic neuropathy and painful elongated mycotic toenails 1-5 bilaterally which are tender when wearing enclosed shoe gear. Pain is relieved with periodic professional debridement.  Chief Complaint  Patient presents with   Diabetes    Trim my toenails.  Beth Deem, FNP - 6 mos ago; A1c - ?   New problem(s): None   PCP is Alejandro Hurt, FNP.  No Known Allergies  Review of Systems: Negative except as noted in the HPI.   Objective:  Beth Lawson is a pleasant 83 y.o. female morbidly obese in NAD. AAO x 3.  Vascular Examination: CFT <3 seconds b/l. DP/PT pulses faintly palpable b/l. Skin temperature gradient warm to warm b/l. No pain with calf compression. No ischemia or gangrene. No cyanosis or clubbing noted b/l. Lymphedema present BLE.   Neurological Examination: Sensation grossly intact b/l with 10 gram monofilament. Vibratory sensation intact b/l. Pt has subjective symptoms of neuropathy.  Dermatological Examination: Pedal skin warm and supple b/l.   No open wounds. No interdigital macerations.  Toenails 1-5 b/l thick, discolored, elongated with subungual debris and pain on dorsal palpation.    No corns, calluses nor porokeratotic lesions noted.  Musculoskeletal Examination: Muscle strength 5/5 to all lower extremity muscle groups bilaterally. No pain, crepitus or joint limitation noted with ROM b/l LE. HAV with bunion deformity noted b/l LE. Hammertoe deformity noted 2-5 b/l.Beth Lawson Patient ambulates with rollator assistance.  Radiographs: None Assessment:   1. Pain due to onychomycosis of toenails of both feet   2. Diabetic peripheral neuropathy associated with type 2 diabetes mellitus (HCC)    Plan:  Consent given for treatment. Patient examined. All patient's and/or POA's questions/concerns addressed on  today's visit. Mycotic toenails 1-5 debrided in length and girth without incident. Continue foot and shoe inspections daily. Monitor blood glucose per PCP/Endocrinologist's recommendations.Continue soft, supportive shoe gear daily. Report any pedal injuries to medical professional. Call office if there are any quesitons/concerns. -Patient/POA to call should there be question/concern in the interim.  Return in about 3 months (around 01/07/2024).  Beth Lawson, [name]      [facility] LOCATION: [address], [place] [address]                   Office [phone]   [facility] LOCATION: [address] [place], [place] [address] Office [phone]

## 2023-12-18 DIAGNOSIS — E039 Hypothyroidism, unspecified: Secondary | ICD-10-CM | POA: Diagnosis not present

## 2023-12-18 DIAGNOSIS — D6869 Other thrombophilia: Secondary | ICD-10-CM | POA: Diagnosis not present

## 2023-12-18 DIAGNOSIS — N1831 Chronic kidney disease, stage 3a: Secondary | ICD-10-CM | POA: Diagnosis not present

## 2023-12-18 DIAGNOSIS — E209 Hypoparathyroidism, unspecified: Secondary | ICD-10-CM | POA: Diagnosis not present

## 2023-12-18 DIAGNOSIS — E1140 Type 2 diabetes mellitus with diabetic neuropathy, unspecified: Secondary | ICD-10-CM | POA: Diagnosis not present

## 2023-12-18 DIAGNOSIS — I10 Essential (primary) hypertension: Secondary | ICD-10-CM | POA: Diagnosis not present

## 2023-12-18 DIAGNOSIS — E782 Mixed hyperlipidemia: Secondary | ICD-10-CM | POA: Diagnosis not present

## 2024-01-21 ENCOUNTER — Encounter: Payer: Self-pay | Admitting: Podiatry

## 2024-01-21 ENCOUNTER — Ambulatory Visit: Admitting: Podiatry

## 2024-01-21 VITALS — Ht 68.0 in | Wt 230.0 lb

## 2024-01-21 DIAGNOSIS — M79675 Pain in left toe(s): Secondary | ICD-10-CM

## 2024-01-21 DIAGNOSIS — M2141 Flat foot [pes planus] (acquired), right foot: Secondary | ICD-10-CM

## 2024-01-21 DIAGNOSIS — M2012 Hallux valgus (acquired), left foot: Secondary | ICD-10-CM | POA: Diagnosis not present

## 2024-01-21 DIAGNOSIS — M2142 Flat foot [pes planus] (acquired), left foot: Secondary | ICD-10-CM

## 2024-01-21 DIAGNOSIS — M79674 Pain in right toe(s): Secondary | ICD-10-CM | POA: Diagnosis not present

## 2024-01-21 DIAGNOSIS — H25813 Combined forms of age-related cataract, bilateral: Secondary | ICD-10-CM | POA: Diagnosis not present

## 2024-01-21 DIAGNOSIS — M2011 Hallux valgus (acquired), right foot: Secondary | ICD-10-CM | POA: Diagnosis not present

## 2024-01-21 DIAGNOSIS — E119 Type 2 diabetes mellitus without complications: Secondary | ICD-10-CM

## 2024-01-21 DIAGNOSIS — B351 Tinea unguium: Secondary | ICD-10-CM

## 2024-01-21 DIAGNOSIS — E1142 Type 2 diabetes mellitus with diabetic polyneuropathy: Secondary | ICD-10-CM

## 2024-01-21 NOTE — Progress Notes (Signed)
  Subjective:  Patient ID: Beth Lawson, female    DOB: 14-Apr-1941,  MRN: [id number]  Beth Lawson presents to clinic today for for annual diabetic foot examination and painful elongated mycotic toenails 1-5 bilaterally which are tender when wearing enclosed shoe gear. Pain is relieved with periodic professional debridement.  Chief Complaint  Patient presents with   Nail Problem    RM 15 RFC. Pt is diabetic (A1C 6.2 ). [name], NP, last visit May 2025.   New problem(s): None.   PCP is [name] SAUNDERS, FNP.  No Known Allergies  Review of Systems: Negative except as noted in the HPI.  Objective: No changes noted in today's physical examination. There were no vitals filed for this visit. Beth Lawson is a pleasant 83 y.o. female obese in NAD. AAO x 3.   Diabetic foot exam was performed with the following findings:   Normal sensation of 10g monofilament Vascular Examination: CFT less than 3 seconds. DP pulses palpable b/l. PT pulses nonpalpable b/l. Digital hair absent. Skin temperature gradient warm to warn b/l. No ischemia or gangrene. No cyanosis or clubbing noted b/l. Lymphedema present BLE.   Neurological Examination: Pt has subjective symptoms of neuropathy. Sensation grossly intact b/l with 10 gram monofilament. Vibratory sensation intact b/l.   Dermatological Examination: Pedal skin thin, shiny and atrophic b/l. No open wounds. No interdigital macerations.   Toenails 1-5 b/l thick, discolored, elongated with subungual debris and pain on dorsal palpation.   No hyperkeratotic nor porokeratotic lesions.  Musculoskeletal Examination: Muscle strength 5/5 to all lower extremity muscle groups bilaterally. No pain, crepitus or joint limitation noted with ROM b/l LE. HAV with bunion deformity noted b/l LE. Pes planus deformity noted bilateral LE.SABRA Patient ambulates with rollator assistance.  Radiographs: None    Assessment/Plan: 1. Pain due to onychomycosis of  toenails of both feet   2. Pes planus of both feet   3. Hallux valgus, acquired, bilateral   4. Diabetic peripheral neuropathy associated with type 2 diabetes mellitus (HCC)   5. Encounter for diabetic foot exam (HCC)    Diabetic foot examination performed today. All patient's and/or POA's questions/concerns addressed on today's visit. Toenails 1-5 debrided in length and girth without incident. Continue foot and shoe inspections daily. Monitor blood glucose per PCP/Endocrinologist's recommendations. Continue soft, supportive shoe gear daily. Report any pedal injuries to medical professional. Call office if there are any questions/concerns. -Patient/POA to call should there be question/concern in the interim.   Return in about 3 months (around 04/21/2024).  Delon LITTIE Merlin, [name]      [facility] LOCATION: [address] [place] [address]                   Office [phone]   [facility] LOCATION: [address] [place], [place] [address] Office [phone]

## 2024-02-03 DIAGNOSIS — I110 Hypertensive heart disease with heart failure: Secondary | ICD-10-CM | POA: Diagnosis not present

## 2024-02-03 DIAGNOSIS — E039 Hypothyroidism, unspecified: Secondary | ICD-10-CM | POA: Diagnosis not present

## 2024-02-03 DIAGNOSIS — I099 Rheumatic heart disease, unspecified: Secondary | ICD-10-CM | POA: Diagnosis not present

## 2024-02-03 DIAGNOSIS — I4891 Unspecified atrial fibrillation: Secondary | ICD-10-CM | POA: Diagnosis not present

## 2024-02-03 DIAGNOSIS — G40909 Epilepsy, unspecified, not intractable, without status epilepticus: Secondary | ICD-10-CM | POA: Diagnosis not present

## 2024-02-03 DIAGNOSIS — I509 Heart failure, unspecified: Secondary | ICD-10-CM | POA: Diagnosis not present

## 2024-02-03 DIAGNOSIS — Z809 Family history of malignant neoplasm, unspecified: Secondary | ICD-10-CM | POA: Diagnosis not present

## 2024-02-03 DIAGNOSIS — E1136 Type 2 diabetes mellitus with diabetic cataract: Secondary | ICD-10-CM | POA: Diagnosis not present

## 2024-02-03 DIAGNOSIS — Z6833 Body mass index (BMI) 33.0-33.9, adult: Secondary | ICD-10-CM | POA: Diagnosis not present

## 2024-02-03 DIAGNOSIS — R32 Unspecified urinary incontinence: Secondary | ICD-10-CM | POA: Diagnosis not present

## 2024-02-03 DIAGNOSIS — E669 Obesity, unspecified: Secondary | ICD-10-CM | POA: Diagnosis not present

## 2024-02-03 DIAGNOSIS — M2010 Hallux valgus (acquired), unspecified foot: Secondary | ICD-10-CM | POA: Diagnosis not present

## 2024-02-03 DIAGNOSIS — Z7984 Long term (current) use of oral hypoglycemic drugs: Secondary | ICD-10-CM | POA: Diagnosis not present

## 2024-02-03 DIAGNOSIS — G3184 Mild cognitive impairment, so stated: Secondary | ICD-10-CM | POA: Diagnosis not present

## 2024-02-03 DIAGNOSIS — D6869 Other thrombophilia: Secondary | ICD-10-CM | POA: Diagnosis not present

## 2024-02-03 DIAGNOSIS — Z7901 Long term (current) use of anticoagulants: Secondary | ICD-10-CM | POA: Diagnosis not present

## 2024-02-03 DIAGNOSIS — E785 Hyperlipidemia, unspecified: Secondary | ICD-10-CM | POA: Diagnosis not present

## 2024-02-03 DIAGNOSIS — E876 Hypokalemia: Secondary | ICD-10-CM | POA: Diagnosis not present

## 2024-02-15 ENCOUNTER — Emergency Department (HOSPITAL_COMMUNITY)

## 2024-02-15 ENCOUNTER — Encounter (HOSPITAL_COMMUNITY): Payer: Self-pay

## 2024-02-15 ENCOUNTER — Emergency Department (HOSPITAL_COMMUNITY)
Admission: EM | Admit: 2024-02-15 | Discharge: 2024-02-15 | Disposition: A | Discharge status: Home / Self Care | Attending: Emergency Medicine | Admitting: Emergency Medicine

## 2024-02-15 ENCOUNTER — Other Ambulatory Visit: Payer: Self-pay

## 2024-02-15 DIAGNOSIS — R911 Solitary pulmonary nodule: Secondary | ICD-10-CM | POA: Diagnosis not present

## 2024-02-15 DIAGNOSIS — I110 Hypertensive heart disease with heart failure: Secondary | ICD-10-CM | POA: Insufficient documentation

## 2024-02-15 DIAGNOSIS — E876 Hypokalemia: Secondary | ICD-10-CM

## 2024-02-15 DIAGNOSIS — I509 Heart failure, unspecified: Secondary | ICD-10-CM | POA: Diagnosis not present

## 2024-02-15 DIAGNOSIS — R531 Weakness: Secondary | ICD-10-CM | POA: Diagnosis not present

## 2024-02-15 DIAGNOSIS — Z7901 Long term (current) use of anticoagulants: Secondary | ICD-10-CM | POA: Insufficient documentation

## 2024-02-15 DIAGNOSIS — Z79899 Other long term (current) drug therapy: Secondary | ICD-10-CM | POA: Insufficient documentation

## 2024-02-15 DIAGNOSIS — I4891 Unspecified atrial fibrillation: Secondary | ICD-10-CM | POA: Diagnosis not present

## 2024-02-15 DIAGNOSIS — R002 Palpitations: Secondary | ICD-10-CM | POA: Diagnosis present

## 2024-02-15 LAB — CBC
HCT: 37.3 % (ref 36.0–46.0)
Hemoglobin: 11.9 g/dL — ABNORMAL LOW (ref 12.0–15.0)
MCH: 28.1 pg (ref 26.0–34.0)
MCHC: 31.9 g/dL (ref 30.0–36.0)
MCV: 88.2 fL (ref 80.0–100.0)
Platelets: 301 K/uL (ref 150–400)
RBC: 4.23 MIL/uL (ref 3.87–5.11)
RDW: 14.9 % (ref 11.5–15.5)
WBC: 6.7 K/uL (ref 4.0–10.5)
nRBC: 0 % (ref 0.0–0.2)

## 2024-02-15 LAB — BASIC METABOLIC PANEL WITH GFR
Anion gap: 16 — ABNORMAL HIGH (ref 5–15)
BUN: 17 mg/dL (ref 8–23)
CO2: 22 mmol/L (ref 22–32)
Calcium: 9 mg/dL (ref 8.9–10.3)
Chloride: 103 mmol/L (ref 98–111)
Creatinine, Ser: 1.08 mg/dL — ABNORMAL HIGH (ref 0.44–1.00)
GFR, Estimated: 51 mL/min — ABNORMAL LOW (ref >60)
Glucose, Bld: 90 mg/dL (ref 70–99)
Potassium: 2.9 mmol/L — ABNORMAL LOW (ref 3.5–5.1)
Sodium: 141 mmol/L (ref 135–145)

## 2024-02-15 LAB — TROPONIN I (HIGH SENSITIVITY)
Troponin I (High Sensitivity): 36 ng/L — ABNORMAL HIGH (ref <18)
Troponin I (High Sensitivity): 63 ng/L — ABNORMAL HIGH (ref <18)

## 2024-02-15 LAB — T4, FREE: Free T4: 0.84 ng/dL (ref 0.61–1.12)

## 2024-02-15 LAB — MAGNESIUM: Magnesium: 1.4 mg/dL — ABNORMAL LOW (ref 1.7–2.4)

## 2024-02-15 LAB — TSH: TSH: 17.315 u[IU]/mL — ABNORMAL HIGH (ref 0.350–4.500)

## 2024-02-15 MED ORDER — ETOMIDATE 2 MG/ML IV SOLN
5.0000 mg | Freq: Once | INTRAVENOUS | Status: AC
  Administered 2024-02-15: 5 mg via INTRAVENOUS
  Filled 2024-02-15: qty 10

## 2024-02-15 MED ORDER — DILTIAZEM HCL 25 MG/5ML IV SOLN
5.0000 mg | Freq: Once | INTRAVENOUS | Status: AC
  Administered 2024-02-15: 5 mg via INTRAVENOUS
  Filled 2024-02-15: qty 5

## 2024-02-15 MED ORDER — MAGNESIUM OXIDE -MG SUPPLEMENT 400 (240 MG) MG PO TABS
400.0000 mg | ORAL_TABLET | Freq: Once | ORAL | Status: AC
  Administered 2024-02-15: 400 mg via ORAL
  Filled 2024-02-15: qty 1

## 2024-02-15 MED ORDER — POTASSIUM CHLORIDE CRYS ER 20 MEQ PO TBCR
40.0000 meq | EXTENDED_RELEASE_TABLET | Freq: Once | ORAL | Status: AC
  Administered 2024-02-15: 40 meq via ORAL
  Filled 2024-02-15: qty 2

## 2024-02-15 NOTE — ED Triage Notes (Addendum)
 Pt BIB [facility] for palpations. Pt reports rapid HR at 0600 this morning that lasted about 20 minutes without other s/s. Pt endorses generalized weakness.  Afib 130-150bpm  Pt given 500ml NS bolus via 20g LAC.  Cbg 95 Bp 140/100 O2 99% RA  At time of triage pt states she is feeling a little better now that she doesn't feel as week as when her heart was racing. Pt A&O x4, denies CP, SOB, N/V/D.

## 2024-02-15 NOTE — ED Provider Triage Note (Signed)
 Emergency Medicine Provider Triage Evaluation Note  Beth Lawson , a 83 y.o. female  was evaluated in triage.  Pt complains of palpitation this morning, feeling generalized weakness, but does feel slightly better now. She is in afib RVR. No CP, SOB.   Review of Systems  Positive: Palpitations Negative: Fever  Physical Exam  BP (!) 154/94 (BP Location: Right Arm)   Pulse (!) 146   Temp 97.6 F (36.4 C) (Oral)   Resp 18   Ht 5' 8 (1.727 m)   Wt 100.7 kg   SpO2 94%   BMI 33.75 kg/m  Gen:   Awake, no distress   Resp:  Normal effort  MSK:   Moves extremities without difficulty  Other:    Medical Decision Making  Medically screening exam initiated at 10:15 AM.  Appropriate orders placed.  Beth Lawson was informed that the remainder of the evaluation will be completed by another provider, this initial triage assessment does not replace that evaluation, and the importance of remaining in the ED until their evaluation is complete.  Noted workup nursing staff aware patient requires bed in ER   Shermon Warren SAILOR, PA-C 02/15/24 1017

## 2024-02-15 NOTE — ED Provider Notes (Signed)
 [place] EMERGENCY DEPARTMENT AT [facility] Provider Note   CSN: [id number] Arrival date & time: 02/15/24  9055     Patient presents with: Palpitations   Beth Lawson is a 83 y.o. female with past medical history of hyperparathyroidism, paroxysmal atrial fibrillation on Eliquis , hypertension, hyperlipidemia, obesity is presenting to emergency room with complaint of palpitations.  Palpitations reportedly started this morning around 6 AM and lasting about 20 minutes with significant fluttering in the chest generalized weakness.  Was evaluated by EMS and given 500 fluid bolus with improvement in symptoms.  She says she still feels quite weak but is feeling better and she denies any chest pain shortness of breath nausea vomiting or diarrhea.  She reports she has not missed any of her doses of Eliquis .      Palpitations      Prior to Admission medications   Medication Sig Start Date End Date Taking? Authorizing Provider  Alcohol Swabs (B-D SINGLE USE SWABS REGULAR) PADS See admin instructions. 02/08/21   [provider]  amLODipine  (NORVASC ) 10 MG tablet Take 10 mg by mouth daily.    [provider]  apixaban  (ELIQUIS ) 5 MG TABS tablet Take 1 tablet (5 mg total) by mouth 2 (two) times daily. 10/15/22 03/10/23  Terra Fairy PARAS, PA-C  atorvastatin  (LIPITOR) 80 MG tablet Take 80 mg by mouth daily.    [provider]  Blood Glucose Calibration (TRUE METRIX LEVEL 1) Low SOLN  02/08/21   [provider]  Blood Glucose Monitoring Suppl (TRUE METRIX METER) w/Device KIT  02/08/21   [provider]  Calcium  Carbonate-Vit D-Min (CALTRATE 600+D PLUS MINERALS) 600-800 MG-UNIT TABS Take 1 tablet by mouth in the morning and at bedtime.    [provider]  ezetimibe (ZETIA) 10 MG tablet Take 10 mg by mouth daily.    [provider]  furosemide (LASIX) 40 MG tablet Take 40 mg by mouth daily.    [provider]  glucose  blood (TRUE METRIX BLOOD GLUCOSE TEST) test strip Use to test your blood sugar 02/07/21   [provider]  Glycerin-Polysorbate 80 (REFRESH DRY EYE THERAPY OP) Apply 1 drop to eye daily.    [provider]  ketoconazole  (NIZORAL ) 2 % cream Apply to both feet and between toes once daily until scales are gone. 12/04/22   Gaynel Delon CROME, [name]  levETIRAcetam  (KEPPRA ) 500 MG tablet Take 500 mg by mouth 2 (two) times daily.    [provider]  levothyroxine  (SYNTHROID ) 175 MCG tablet Take 175 mcg by mouth daily before breakfast. 05/26/20   [provider]  losartan -hydrochlorothiazide (HYZAAR) 50-12.5 MG tablet Take 1 tablet by mouth daily.    [provider]  metFORMIN  (GLUCOPHAGE ) 500 MG tablet Take 1 tablet by mouth 2 (two) times daily with a meal.    [provider]  metoprolol  succinate (TOPROL -XL) 100 MG 24 hr tablet Take 100 mg by mouth daily.    [provider]  Multiple Vitamins-Minerals (CENTRUM SILVER 50+WOMEN) TABS Take 1 tablet by mouth daily.    [provider]  potassium chloride  (KLOR-CON ) 10 MEQ tablet TAKE 2 TABLETS EVERY DAY 05/05/23   Terra Fairy PARAS, PA-C  triamcinolone cream (KENALOG) 0.1 % Apply 1 Application topically 2 (two) times daily. 01/03/23   [provider]  TRUEplus Lancets 33G MISC Use to test your blood sugar 02/07/21   [provider]    Allergies: Patient has no known allergies.    Review  of Systems  Cardiovascular:  Positive for palpitations.    Updated Vital Signs BP (!) 135/115   Pulse 94   Temp 97.8 F (36.6 C) (Oral)   Resp (!) 23   Ht 5' 8 (1.727 m)   Wt 100.7 kg   SpO2 100%   BMI 33.75 kg/m   Physical Exam Vitals and nursing note reviewed.  Constitutional:      General: She is not in acute distress.    Appearance: She is not toxic-appearing.  HENT:     Head: Normocephalic and atraumatic.  Eyes:     General: No scleral icterus.    Conjunctiva/sclera:  Conjunctivae normal.  Cardiovascular:     Rate and Rhythm: Tachycardia present. Rhythm irregular.     Pulses: Normal pulses.     Heart sounds: Normal heart sounds.     Comments: A-fib RVR Pulmonary:     Effort: Pulmonary effort is normal. No respiratory distress.     Breath sounds: Normal breath sounds.  Abdominal:     General: Abdomen is flat. Bowel sounds are normal.     Palpations: Abdomen is soft.     Tenderness: There is no abdominal tenderness.  Musculoskeletal:     Right lower leg: Edema present.     Left lower leg: Edema present.  Skin:    General: Skin is warm and dry.     Findings: No lesion.  Neurological:     General: No focal deficit present.     Mental Status: She is alert and oriented to person, place, and time. Mental status is at baseline.     (all labs ordered are listed, but only abnormal results are displayed) Labs Reviewed  BASIC METABOLIC PANEL WITH GFR - Abnormal; Notable for the following components:      Result Value   Potassium 2.9 (*)    Creatinine, Ser 1.08 (*)    GFR, Estimated 51 (*)    Anion gap 16 (*)    All other components within normal limits  CBC - Abnormal; Notable for the following components:   Hemoglobin 11.9 (*)    All other components within normal limits  TSH - Abnormal; Notable for the following components:   TSH 17.315 (*)    All other components within normal limits  MAGNESIUM - Abnormal; Notable for the following components:   Magnesium 1.4 (*)    All other components within normal limits  TROPONIN I (HIGH SENSITIVITY) - Abnormal; Notable for the following components:   Troponin I (High Sensitivity) 36 (*)    All other components within normal limits  TROPONIN I (HIGH SENSITIVITY) - Abnormal; Notable for the following components:   Troponin I (High Sensitivity) 63 (*)    All other components within normal limits  T4, FREE    EKG: EKG Interpretation Date/Time:  Sunday February 15 2024 14:25:12 EDT Ventricular Rate:   98 PR Interval:  206 QRS Duration:  99 QT Interval:  366 QTC Calculation: 468 R Axis:   103  Text Interpretation: Sinus rhythm Atrial premature complex Probable lateral infarct, age indeterminate Confirmed by Patsey Lot [phone]) on 02/15/2024 2:31:41 PM  Radiology: [facility] Chest 2 View Result Date: 02/15/2024 CLINICAL DATA:  Atrial fibrillation. EXAM: CHEST - 2 VIEW COMPARISON:  October 11, 2020 FINDINGS: The heart size and mediastinal contours are within normal limits. Ill-defined 6 mm nodular opacities are seen overlying the bilateral lung bases. No focal consolidation, pleural effusion or pneumothorax is identified. Multilevel degenerative changes are seen throughout the thoracic  spine. IMPRESSION: Ill-defined 6 mm nodular opacities overlying the bilateral lung bases. Correlation with nonemergent chest CT is recommended to exclude the presence of a pulmonary nodule. Electronically Signed   By: Suzen Dials M.D.   On: 02/15/2024 12:35     Procedures   Medications Ordered in the ED  diltiazem (CARDIZEM) injection 5 mg (5 mg Intravenous Given 02/15/24 1329)  etomidate (AMIDATE) injection 5 mg (5 mg Intravenous Given 02/15/24 1416)    Clinical Course as of 02/15/24 1451  Sun Feb 15, 2024  1436 Patient underwent cardioversion here in ED with Dr. Patsey and myself.  After cardioversion patient was found to be in normal sinus rhythm on EKG. [JB]    Clinical Course User Index [JB] Rainn Zupko, Warren SAILOR, PA-C                                 Medical Decision Making Amount and/or Complexity of Data Reviewed Labs: ordered. Radiology: ordered. ECG/medicine tests: ordered.  Risk Prescription drug management.   This patient presents to the ED for concern of chest pain, this involves an extensive number of treatment options, and is a complaint that carries with it a high risk of complications and morbidity.  The differential diagnosis includes ACS, stable angina, CHF, pneumonia,  pneumothorax, aortic dissection, pulmonary embolus    Co morbidities that complicate the patient evaluation  HFpEF   Additional history obtained:  Additional history obtained from cardiology visit 03/10/23 patient seems to be in NSR at baseline.    Lab Tests:  I personally interpreted labs.  The pertinent results include:   K 2.9, Mag 1.4, TSH 17 Troponin 36 likely secondary to demand, no CP reported    Imaging Studies ordered:  I ordered imaging studies including chest x-ray I independently visualized and interpreted imaging which showed small pulmonary nodule, will need CT outpatient. I agree with the radiologist interpretation   Cardiac Monitoring: / EKG:  The patient was maintained on a cardiac monitor.  I personally viewed and interpreted the cardiac monitored which showed an underlying rhythm of: afib rvr with rate 140s   Problem List / ED Course / Critical interventions / Medication management  Patient presents to emergency room with complaint of palpitations.  Patient was found to be in A-fib RVR with EMS.  Patient was not hypotensive.  This lasted about 20 minutes and then she did have some improvement in symptoms after receiving some fluids.  On arrival to ER she does continue to be in A-fib RVR.  She does have some mild electrolyte abnormalities that were supplemented orally here.  Chest x-ray is not acutely remarkable.  She denies any infectious symptoms.  Attending and I discussed rate control options.  Since she has been dependable with Eliquis  and quite symptomatic with A-fib RVR she did want to proceed with cardioversion.  Patient received cardioversion here in the ED and then was observed for period of time.  EKG does confirm that she is in normal sinus rhythm.  She does report she is feeling much better and she is requesting discharge I ordered medication including oral potassium, oral magnesium Reevaluation of the patient after these medicines showed that the  patient improved I have reviewed the patients home medicines and have made adjustments as needed. After cardioversion patient hemodynamically stable and well-appearing.  Symptoms have significantly improved and patient is requesting to be discharged.  Will place referral to cardiology and A-fib clinic.  Given strict return precautions.  Given follow-up instructions.      Final diagnoses:  Atrial fibrillation with RVR [facility])    ED Discharge Orders          Ordered    Ambulatory referral to Cardiology       Comments: If you have not heard from the Cardiology office within the next 72 hours please call [phone].   02/15/24 1450    Amb Referral to AFIB Clinic        02/15/24 1450               Chilton Sallade, Warren SAILOR, PA-C 02/15/24 1500    Patsey Lot, MD 02/18/24 1101

## 2024-02-15 NOTE — Discharge Instructions (Addendum)
 It was a pleasure taking care of you today.  You will need to follow-up with cardiology.  I have placed a referral but if you had not heard from them in 2 to 3 days please call to schedule appointment.  X-ray shows small pulmonary nodule please follow-up outpatient with a CT scan of your chest, your primary care doctor should be able to order this for you.  Please return to emergency room with any new or worsening symptoms.  Make sure you are continuing your current medication and continue taking Eliquis  as prescribed.

## 2024-02-16 DIAGNOSIS — I4891 Unspecified atrial fibrillation: Secondary | ICD-10-CM | POA: Diagnosis not present

## 2024-02-23 ENCOUNTER — Ambulatory Visit (HOSPITAL_COMMUNITY)
Admission: RE | Admit: 2024-02-23 | Discharge: 2024-02-23 | Disposition: A | Discharge status: Home / Self Care | Source: Ambulatory Visit | Attending: Internal Medicine | Admitting: Internal Medicine

## 2024-02-23 ENCOUNTER — Encounter (HOSPITAL_COMMUNITY): Payer: Self-pay | Admitting: Internal Medicine

## 2024-02-23 VITALS — BP 118/80 | HR 95 | Ht 68.0 in | Wt 229.2 lb

## 2024-02-23 DIAGNOSIS — D6869 Other thrombophilia: Secondary | ICD-10-CM

## 2024-02-23 DIAGNOSIS — I480 Paroxysmal atrial fibrillation: Secondary | ICD-10-CM | POA: Diagnosis not present

## 2024-02-23 MED ORDER — AMIODARONE HCL 200 MG PO TABS: ORAL_TABLET | ORAL | 3 refills | Status: DC

## 2024-02-23 NOTE — Patient Instructions (Signed)
 Start Amiodarone 200 mg twice a day for 30 days then decrease to 200 mg once a day  Do not miss any doses of Eliquis 

## 2024-02-23 NOTE — Progress Notes (Signed)
 Primary Care Physician: Marvene Prentice SAUNDERS, FNP Referring Physician: ER MD   Beth Lawson is a 83 y.o. female with a h/o of T2DM, hypertension, hyperlipidemia, seizures & hypothyroidism who presented to the ED 10/11/20 via EMS with complaints of chest discomfort that began shortly prior to arrival, but shortly resolved. Pt felt she got overheated and that brought on the afib. This is a new diagnosis. She was continued on her metoprolol  and started on eliquis  5 mg bid for a CHA2DS2VASc score of at least 5. She is in SR today and has not noted any further afib.   F/u in afib clinic, 12/07/20. Echo done this am. Has been on anticoagulation for new onset afib noted in ER in June. Has done well with out any bleeding issues. Ekg shows SR and she has not noted any irregular HR.   F/u in afib clinic,12/26/21.  Pt is in SR. No noted irregular HR.  No voiced complaints today. No issues with anticoagulation. Her husband that was in a SNF passed away last month.   F/u in Afib clinic, 03/10/23. Patient is currently in NSR. She has had no episodes of Afib since last office visit. No bleeding issues on Eliquis  5 mg BID. She is upset at the price increase on the Eliquis  prescription.   Follow up in Afib clinic, 02/23/24. Patient is currently in Afib. She was seen in ED on 10/19 for Afib with RVR and underwent successful DCCV. No missed doses of Eliquis .   Today, she denies symptoms of palpitations, chest pain, shortness of breath, orthopnea, PND, lower extremity edema, dizziness, presyncope, syncope, or neurologic sequela. The patient is tolerating medications without difficulties and is otherwise without complaint today.   Past Medical History:  Diagnosis Date   Diabetes mellitus, type 2 (HCC)    HTN (hypertension)    Hyperlipidemia    Hypothyroidism    Seizures (HCC)    Past Surgical History:  Procedure Laterality Date   ABDOMINAL HYSTERECTOMY     BREAST SURGERY     THYROIDECTOMY  1988    Current  Outpatient Medications  Medication Sig Dispense Refill   Alcohol Swabs (B-D SINGLE USE SWABS REGULAR) PADS See admin instructions.     amLODipine  (NORVASC ) 10 MG tablet Take 10 mg by mouth daily.     apixaban  (ELIQUIS ) 5 MG TABS tablet Take 1 tablet (5 mg total) by mouth 2 (two) times daily. 180 tablet 1   atorvastatin  (LIPITOR) 80 MG tablet Take 80 mg by mouth daily.     Blood Glucose Calibration (TRUE METRIX LEVEL 1) Low SOLN      Blood Glucose Monitoring Suppl (TRUE METRIX METER) w/Device KIT      Calcium  Carbonate-Vit D-Min (CALTRATE 600+D PLUS MINERALS) 600-800 MG-UNIT TABS Take 1 tablet by mouth in the morning and at bedtime.     ezetimibe (ZETIA) 10 MG tablet Take 10 mg by mouth daily.     furosemide (LASIX) 40 MG tablet Take 40 mg by mouth daily.     glucose blood (TRUE METRIX BLOOD GLUCOSE TEST) test strip Use to test your blood sugar     Glycerin-Polysorbate 80 (REFRESH DRY EYE THERAPY OP) Apply 1 drop to eye daily.     ketoconazole  (NIZORAL ) 2 % cream Apply to both feet and between toes once daily until scales are gone. 60 g 2   levETIRAcetam  (KEPPRA ) 500 MG tablet Take 500 mg by mouth 2 (two) times daily.     levothyroxine  (SYNTHROID ) 175 MCG tablet Take  175 mcg by mouth daily before breakfast.     losartan -hydrochlorothiazide (HYZAAR) 50-12.5 MG tablet Take 1 tablet by mouth daily.     metFORMIN  (GLUCOPHAGE ) 500 MG tablet Take 1 tablet by mouth 2 (two) times daily with a meal.     metoprolol  succinate (TOPROL -XL) 100 MG 24 hr tablet Take 100 mg by mouth daily.     Multiple Vitamins-Minerals (CENTRUM SILVER 50+WOMEN) TABS Take 1 tablet by mouth daily.     potassium chloride  (KLOR-CON ) 10 MEQ tablet TAKE 2 TABLETS EVERY DAY 180 tablet 3   triamcinolone cream (KENALOG) 0.1 % Apply 1 Application topically 2 (two) times daily.     TRUEplus Lancets 33G MISC Use to test your blood sugar     No current facility-administered medications for this visit.    No Known Allergies  ROS- All  systems are reviewed and negative except as per the HPI above  Physical Exam: There were no vitals filed for this visit.   Wt Readings from Last 3 Encounters:  02/15/24 100.7 kg  01/21/24 104.3 kg  06/10/23 104.3 kg    Labs: Lab Results  Component Value Date   NA 141 02/15/2024   K 2.9 (L) 02/15/2024   CL 103 02/15/2024   CO2 22 02/15/2024   GLUCOSE 90 02/15/2024   BUN 17 02/15/2024   CREATININE 1.08 (H) 02/15/2024   CALCIUM  9.0 02/15/2024   MG 1.4 (L) 02/15/2024   No results found for: INR No results found for: CHOL, HDL, LDLCALC, TRIG  GEN- The patient is well appearing, alert and oriented x 3 today.   Neck - no JVD or carotid bruit noted Lungs- Clear to ausculation bilaterally, normal work of breathing Heart- Irregular rate and rhythm, no murmurs, rubs or gallops, PMI not laterally displaced Extremities- no clubbing, cyanosis, or edema Skin - no rash or ecchymosis noted   EKG- Vent. rate 95 BPM PR interval * ms QRS duration 80 ms QT/QTcB 352/442 ms P-R-T axes * 47 26 Atrial fibrillation Low voltage QRS Septal infarct , age undetermined Abnormal ECG When compared with ECG of 15-Feb-2024 14:25, PREVIOUS ECG IS PRESENT   Echo 01/28/22-  1. Left ventricular ejection fraction, by estimation, is 65 to 70%. Left  ventricular ejection fraction by 3D volume is 67 %. The left ventricle has  normal function. The left ventricle has no regional wall motion  abnormalities. There is mild left  ventricular hypertrophy. Left ventricular diastolic parameters are  consistent with Grade I diastolic dysfunction (impaired relaxation).   2. Right ventricular systolic function is normal. The right ventricular  size is normal. There is mildly elevated pulmonary artery systolic  pressure. The estimated right ventricular systolic pressure is 36.4 mmHg.   3. Left atrial size was mildly dilated.   4. The mitral valve is grossly normal. Trivial mitral valve  regurgitation.    5. The aortic valve is tricuspid. Aortic valve regurgitation is not  visualized.   6. The inferior vena cava is normal in size with greater than 50%  respiratory variability, suggesting right atrial pressure of 3 mmHg.   Comparison(s): Changes from prior study are noted. 12/07/2020: LVEF 60-65%,  grade 2 DD, elevated LVEDP.    Assessment and Plan:  1. New onset afib 10/11/20 with ER visit   Patient is currently in AFib. Recent ED visit on 10/19 s/p DCCV. We discussed rhythm control options and after discussion patient will try amiodarone. We discussed the adverse effects of this medication and how monitoring is required.  Patient will begin amiodarone 200 mg BID x 4 weeks then transition to 200 mg once daily. Continue Toprol  100 mg daily, may need to lower if HR decreases. Explained to patient may require repeat cardioversion if still in Afib.   2. CHA2DS2VASc score of 5 Continue Eliquis  5 mg BID.    Follow up 3 weeks to reassess.    Dorn Heinrich, PA-C  [facility] [address] [place], [place] [address] [phone]

## 2024-02-24 MED ORDER — AMIODARONE HCL 200 MG PO TABS: ORAL_TABLET | ORAL | 2 refills | Status: DC

## 2024-02-24 NOTE — Addendum Note (Signed)
 Encounter addended by: Janel Nancy SAUNDERS, RN on: 02/24/2024 10:32 AM  Actions taken: Pharmacy for encounter modified, Order list changed

## 2024-03-01 ENCOUNTER — Telehealth (HOSPITAL_COMMUNITY): Payer: Self-pay | Admitting: *Deleted

## 2024-03-01 MED ORDER — AMIODARONE HCL 200 MG PO TABS: 200.0000 mg | ORAL_TABLET | Freq: Every day | ORAL | Status: AC

## 2024-03-01 NOTE — Telephone Encounter (Signed)
 Pt called reporting not feeling well after evening dose amiodarone. She feels weak and SOB. Pt is unable to monitor HR and BP no device to check at home. Discussed with J.Suarez PA. and he wants pt to decrease amiodarone to once daily. Pt verbalized understanding and agrees with plan already has appt made for follow up. Pt will call back if needed.

## 2024-03-09 ENCOUNTER — Ambulatory Visit (HOSPITAL_COMMUNITY): Admitting: Internal Medicine

## 2024-03-11 DIAGNOSIS — E890 Postprocedural hypothyroidism: Secondary | ICD-10-CM | POA: Diagnosis not present

## 2024-03-11 DIAGNOSIS — E1140 Type 2 diabetes mellitus with diabetic neuropathy, unspecified: Secondary | ICD-10-CM | POA: Diagnosis not present

## 2024-03-11 DIAGNOSIS — I10 Essential (primary) hypertension: Secondary | ICD-10-CM | POA: Diagnosis not present

## 2024-03-11 DIAGNOSIS — Z6835 Body mass index (BMI) 35.0-35.9, adult: Secondary | ICD-10-CM | POA: Diagnosis not present

## 2024-03-11 DIAGNOSIS — Z23 Encounter for immunization: Secondary | ICD-10-CM | POA: Diagnosis not present

## 2024-03-11 DIAGNOSIS — N1831 Chronic kidney disease, stage 3a: Secondary | ICD-10-CM | POA: Diagnosis not present

## 2024-03-11 DIAGNOSIS — I480 Paroxysmal atrial fibrillation: Secondary | ICD-10-CM | POA: Diagnosis not present

## 2024-03-11 DIAGNOSIS — E782 Mixed hyperlipidemia: Secondary | ICD-10-CM | POA: Diagnosis not present

## 2024-03-11 DIAGNOSIS — Z0000 Encounter for general adult medical examination without abnormal findings: Secondary | ICD-10-CM | POA: Diagnosis not present

## 2024-03-15 ENCOUNTER — Encounter (HOSPITAL_COMMUNITY): Payer: Self-pay | Admitting: Internal Medicine

## 2024-03-15 ENCOUNTER — Ambulatory Visit (HOSPITAL_COMMUNITY)
Admission: RE | Admit: 2024-03-15 | Discharge: 2024-03-15 | Disposition: A | Discharge status: Home / Self Care | Source: Ambulatory Visit | Attending: Internal Medicine | Admitting: Internal Medicine

## 2024-03-15 VITALS — BP 116/70 | HR 91 | Ht 68.0 in | Wt 239.0 lb

## 2024-03-15 DIAGNOSIS — I4891 Unspecified atrial fibrillation: Secondary | ICD-10-CM | POA: Insufficient documentation

## 2024-03-15 DIAGNOSIS — I4819 Other persistent atrial fibrillation: Secondary | ICD-10-CM | POA: Diagnosis not present

## 2024-03-15 DIAGNOSIS — Z5181 Encounter for therapeutic drug level monitoring: Secondary | ICD-10-CM

## 2024-03-15 DIAGNOSIS — I480 Paroxysmal atrial fibrillation: Secondary | ICD-10-CM | POA: Diagnosis not present

## 2024-03-15 DIAGNOSIS — D6869 Other thrombophilia: Secondary | ICD-10-CM | POA: Diagnosis not present

## 2024-03-15 DIAGNOSIS — Z79899 Other long term (current) drug therapy: Secondary | ICD-10-CM | POA: Diagnosis not present

## 2024-03-15 NOTE — Patient Instructions (Addendum)
 Increase Lasix to 40 mg twice a day for 3 days   Increase potassium to 40 meq once a day for 3 days   HOLD MORNING METFORMIN  DAY OF PROCEDURE and any diabetic meds resume after  Cardioversion scheduled for: 03/19/24 Friday at  9:30 am    - Arrive at the [employer] [facility] ([address])  and check in with ADMITTING at 9:30 am    - Do not eat or drink anything after midnight the night prior to your procedure.   - Take all your morning medication (except diabetic medications) with a sip of water prior to arrival.  - Do NOT miss any doses of your blood thinner - if you should miss a dose or take a dose more than 4 hours late -- please notify our office immediately.  - You will not be able to drive home after your procedure. Please ensure you have a responsible adult to drive you home. You will need someone with you for 24 hours post procedure.     - Expect to be in the procedural area approximately 2 hours.   - If you feel as if you go back into normal rhythm prior to scheduled cardioversion, please notify our office immediately.   If your procedure is canceled in the cardioversion suite you will be charged a cancellation fee.

## 2024-03-15 NOTE — Progress Notes (Addendum)
 Primary Care Physician: Marvene Prentice SAUNDERS, FNP Referring Physician: ER MD   Beth Lawson is a 83 y.o. female with a h/o of T2DM, hypertension, hyperlipidemia, seizures & hypothyroidism who presented to the ED 10/11/20 via EMS with complaints of chest discomfort that began shortly prior to arrival, but shortly resolved. Pt felt she got overheated and that brought on the afib. This is a new diagnosis. She was continued on her metoprolol  and started on eliquis  5 mg bid for a CHA2DS2VASc score of at least 5. She is in SR today and has not noted any further afib.   F/u in afib clinic, 12/07/20. Echo done this am. Has been on anticoagulation for new onset afib noted in ER in June. Has done well with out any bleeding issues. Ekg shows SR and she has not noted any irregular HR.   F/u in afib clinic,12/26/21.  Pt is in SR. No noted irregular HR.  No voiced complaints today. No issues with anticoagulation. Her husband that was in a SNF passed away last month.   F/u in Afib clinic, 03/10/23. Patient is currently in NSR. She has had no episodes of Afib since last office visit. No bleeding issues on Eliquis  5 mg BID. She is upset at the price increase on the Eliquis  prescription.   Follow up in Afib clinic, 02/23/24. Patient is currently in Afib. She was seen in ED on 10/19 for Afib with RVR and underwent successful DCCV. No missed doses of Eliquis .   Follow up in Afib clinic, 03/15/24. She is currently in Afib. Patient began amiodarone load at last office visit due to ERAF following DCCV in the ED. She is tolerating amiodarone alright at dosage of 200 mg once daily and has not missed any doses of Eliquis . She could not tolerate amiodarone load.   Today, she denies symptoms of palpitations, chest pain, shortness of breath, orthopnea, PND, dizziness, presyncope, syncope, or neurologic sequela. The patient is tolerating medications without difficulties and is otherwise without complaint today.   Past Medical  History:  Diagnosis Date   Diabetes mellitus, type 2 (HCC)    HTN (hypertension)    Hyperlipidemia    Hypothyroidism    Seizures (HCC)    Past Surgical History:  Procedure Laterality Date   ABDOMINAL HYSTERECTOMY     BREAST SURGERY     THYROIDECTOMY  1988    Current Outpatient Medications  Medication Sig Dispense Refill   Alcohol Swabs (B-D SINGLE USE SWABS REGULAR) PADS See admin instructions.     amiodarone (PACERONE) 200 MG tablet Take 1 tablet (200 mg total) by mouth daily.     amLODipine  (NORVASC ) 10 MG tablet Take 10 mg by mouth daily.     apixaban  (ELIQUIS ) 5 MG TABS tablet Take 1 tablet (5 mg total) by mouth 2 (two) times daily. 180 tablet 1   atorvastatin  (LIPITOR) 80 MG tablet Take 80 mg by mouth daily.     Blood Glucose Calibration (TRUE METRIX LEVEL 1) Low SOLN      Blood Glucose Monitoring Suppl (TRUE METRIX METER) w/Device KIT      Calcium  Carbonate-Vit D-Min (CALTRATE 600+D PLUS MINERALS) 600-800 MG-UNIT TABS Take 1 tablet by mouth in the morning and at bedtime.     ezetimibe (ZETIA) 10 MG tablet Take 10 mg by mouth daily.     furosemide (LASIX) 40 MG tablet Take 40 mg by mouth daily.     glucose blood (TRUE METRIX BLOOD GLUCOSE TEST) test strip Use to test your  blood sugar     Glycerin-Polysorbate 80 (REFRESH DRY EYE THERAPY OP) Apply 1 drop to eye daily.     ketoconazole  (NIZORAL ) 2 % cream Apply to both feet and between toes once daily until scales are gone. 60 g 2   levETIRAcetam  (KEPPRA ) 500 MG tablet Take 500 mg by mouth 2 (two) times daily.     levothyroxine  (SYNTHROID ) 175 MCG tablet Take 175 mcg by mouth daily before breakfast.     losartan -hydrochlorothiazide (HYZAAR) 50-12.5 MG tablet Take 1 tablet by mouth daily.     metFORMIN  (GLUCOPHAGE ) 500 MG tablet Take 1 tablet by mouth 2 (two) times daily with a meal.     metoprolol  succinate (TOPROL -XL) 100 MG 24 hr tablet Take 100 mg by mouth daily.     Multiple Vitamins-Minerals (CENTRUM SILVER 50+WOMEN) TABS  Take 1 tablet by mouth daily.     potassium chloride  (KLOR-CON ) 10 MEQ tablet TAKE 2 TABLETS EVERY DAY 180 tablet 3   triamcinolone cream (KENALOG) 0.1 % Apply 1 Application topically 2 (two) times daily.     TRUEplus Lancets 33G MISC Use to test your blood sugar     No current facility-administered medications for this encounter.    No Known Allergies  ROS- All systems are reviewed and negative except as per the HPI above  Physical Exam: Vitals:   03/15/24 0904  BP: 116/70  Pulse: 91  Weight: 108.4 kg  Height: 5' 8 (1.727 m)     Wt Readings from Last 3 Encounters:  03/15/24 108.4 kg  02/23/24 104 kg  02/15/24 100.7 kg    Labs: Lab Results  Component Value Date   NA 141 02/15/2024   K 2.9 (L) 02/15/2024   CL 103 02/15/2024   CO2 22 02/15/2024   GLUCOSE 90 02/15/2024   BUN 17 02/15/2024   CREATININE 1.08 (H) 02/15/2024   CALCIUM  9.0 02/15/2024   MG 1.4 (L) 02/15/2024   No results found for: INR No results found for: CHOL, HDL, LDLCALC, TRIG  GEN- The patient is well appearing, alert and oriented x 3 today.   Neck - no JVD or carotid bruit noted Lungs- Clear to ausculation bilaterally, normal work of breathing Heart- Irregular rate and rhythm, no murmurs, rubs or gallops, PMI not laterally displaced Extremities- +2 pitting edema bilateral LE Skin - no rash or ecchymosis noted   EKG- Vent. rate 91 BPM PR interval * ms QRS duration 72 ms QT/QTcB 376/462 ms P-R-T axes * 49 16 Atrial fibrillation Low voltage QRS Abnormal ECG When compared with ECG of 23-Feb-2024 09:24, No significant change was found Confirmed by Terra Pac ([id number]) on 03/15/2024 9:13:05 AM   Echo 01/28/22-  1. Left ventricular ejection fraction, by estimation, is 65 to 70%. Left  ventricular ejection fraction by 3D volume is 67 %. The left ventricle has  normal function. The left ventricle has no regional wall motion  abnormalities. There is mild left  ventricular  hypertrophy. Left ventricular diastolic parameters are  consistent with Grade I diastolic dysfunction (impaired relaxation).   2. Right ventricular systolic function is normal. The right ventricular  size is normal. There is mildly elevated pulmonary artery systolic  pressure. The estimated right ventricular systolic pressure is 36.4 mmHg.   3. Left atrial size was mildly dilated.   4. The mitral valve is grossly normal. Trivial mitral valve  regurgitation.   5. The aortic valve is tricuspid. Aortic valve regurgitation is not  visualized.   6. The inferior vena cava  is normal in size with greater than 50%  respiratory variability, suggesting right atrial pressure of 3 mmHg.   Comparison(s): Changes from prior study are noted. 12/07/2020: LVEF 60-65%,  grade 2 DD, elevated LVEDP.    Assessment and Plan:  1. Persistent Afib  Patient is currently in Afib. She began amiodarone load at last office visit due to ERAF following cardioversion in the ED. She had to decrease amiodarone from BID to 200 mg once daily due to adverse effects. We discussed the procedure cardioversion to try to convert to NSR. We discussed the risks vs benefits of this procedure and how ultimately we cannot predict whether a patient will have early return of arrhythmia post procedure. After discussion, the patient wishes to proceed with cardioversion. Labs drawn today.   Informed Consent   Shared Decision Making/Informed Consent The risks (stroke, cardiac arrhythmias rarely resulting in the need for a temporary or permanent pacemaker, skin irritation or burns and complications associated with conscious sedation including aspiration, arrhythmia, respiratory failure and death), benefits (restoration of normal sinus rhythm) and alternatives of a direct current cardioversion were explained in detail to Ms. Wilmarth and she agrees to proceed.       2. CHA2DS2VASc score of 5 No missed doses of Eliquis .  3. High risk medication  monitoring (ICD10: [id number]) Patient requires ongoing monitoring for anti-arrhythmic medication which has the potential to cause life threatening arrhythmias or AV block. Qtc stable. Continue amiodarone 200 mg daily.  4. Edema Increase lasix to 40 mg BID x 3 days and increase potassium to 40 meq daily x 3 days then resume previous dosing.     Follow up 2 weeks after DCCV.    Dorn Heinrich, PA-C  Af[facility] [address] [place], [place] [address] [phone]

## 2024-03-16 ENCOUNTER — Ambulatory Visit (HOSPITAL_COMMUNITY): Payer: Self-pay | Admitting: Internal Medicine

## 2024-03-16 ENCOUNTER — Telehealth (HOSPITAL_COMMUNITY): Payer: Self-pay | Admitting: *Deleted

## 2024-03-16 LAB — CBC
Hematocrit: 37.1 % (ref 34.0–46.6)
Hemoglobin: 12 g/dL (ref 11.1–15.9)
MCH: 29.2 pg (ref 26.6–33.0)
MCHC: 32.3 g/dL (ref 31.5–35.7)
MCV: 90 fL (ref 79–97)
Platelets: 247 x10E3/uL (ref 150–450)
RBC: 4.11 x10E6/uL (ref 3.77–5.28)
RDW: 14.3 % (ref 11.7–15.4)
WBC: 7.2 x10E3/uL (ref 3.4–10.8)

## 2024-03-16 LAB — BASIC METABOLIC PANEL WITH GFR
BUN/Creatinine Ratio: 15 (ref 12–28)
BUN: 21 mg/dL (ref 8–27)
CO2: 26 mmol/L (ref 20–29)
Calcium: 9.6 mg/dL (ref 8.7–10.3)
Chloride: 104 mmol/L (ref 96–106)
Creatinine, Ser: 1.37 mg/dL — ABNORMAL HIGH (ref 0.57–1.00)
Glucose: 108 mg/dL — ABNORMAL HIGH (ref 70–99)
Potassium: 3.9 mmol/L (ref 3.5–5.2)
Sodium: 144 mmol/L (ref 134–144)
eGFR: 38 mL/min/1.73 — ABNORMAL LOW (ref >59)

## 2024-03-16 NOTE — Telephone Encounter (Signed)
 Patient called to cancel cardioversion scheduled for later this week. She was not ready to reschedule on the call stating she wasn't ready to schedule anything. We will plan to keep scheduled follow up in a few weeks to determine plan. Pt in agreement.

## 2024-03-19 ENCOUNTER — Encounter (HOSPITAL_COMMUNITY): Payer: Self-pay

## 2024-03-19 ENCOUNTER — Ambulatory Visit (HOSPITAL_COMMUNITY): Admit: 2024-03-19 | Admitting: Cardiology

## 2024-03-19 DIAGNOSIS — I4819 Other persistent atrial fibrillation: Secondary | ICD-10-CM

## 2024-03-19 SURGERY — CARDIOVERSION (CATH LAB)
Anesthesia: Monitor Anesthesia Care

## 2024-04-01 ENCOUNTER — Ambulatory Visit (HOSPITAL_COMMUNITY): Attending: Internal Medicine | Admitting: Internal Medicine

## 2024-04-01 NOTE — Progress Notes (Incomplete)
 Primary Care Physician: Marvene Prentice SAUNDERS, FNP Referring Physician: ER MD   Beth Lawson is a 83 y.o. female with a h/o of T2DM, hypertension, hyperlipidemia, seizures & hypothyroidism who presented to the ED 10/11/20 via EMS with complaints of chest discomfort that began shortly prior to arrival, but shortly resolved. Pt felt she got overheated and that brought on the afib. This is a new diagnosis. She was continued on her metoprolol  and started on eliquis  5 mg bid for a CHA2DS2VASc score of at least 5. She is in SR today and has not noted any further afib.   F/u in afib clinic, 12/07/20. Echo done this am. Has been on anticoagulation for new onset afib noted in ER in June. Has done well with out any bleeding issues. Ekg shows SR and she has not noted any irregular HR.   F/u in afib clinic,12/26/21.  Pt is in SR. No noted irregular HR.  No voiced complaints today. No issues with anticoagulation. Her husband that was in a SNF passed away last month.   F/u in Afib clinic, 03/10/23. Patient is currently in NSR. She has had no episodes of Afib since last office visit. No bleeding issues on Eliquis  5 mg BID. She is upset at the price increase on the Eliquis  prescription.   Follow up in Afib clinic, 02/23/24. Patient is currently in Afib. She was seen in ED on 10/19 for Afib with RVR and underwent successful DCCV. No missed doses of Eliquis .   Follow up in Afib clinic, 03/15/24. She is currently in Afib. Patient began amiodarone  load at last office visit due to ERAF following DCCV in the ED. She is tolerating amiodarone  alright at dosage of 200 mg once daily and has not missed any doses of Eliquis . She could not tolerate amiodarone  load.   Follow-up in A-fib clinic, 04/01/2024.  Patient was previously scheduled for cardioversion on 11/21 but contacted the clinic to schedule the procedure.  She was unsure about doing another cardioversion and wanted to wait.  She is taking amiodarone  200 mg daily.  No  missed doses of Eliquis .  Today, she denies symptoms of palpitations, chest pain, shortness of breath, orthopnea, PND, dizziness, presyncope, syncope, or neurologic sequela. The patient is tolerating medications without difficulties and is otherwise without complaint today.   Past Medical History:  Diagnosis Date   Diabetes mellitus, type 2 (HCC)    HTN (hypertension)    Hyperlipidemia    Hypothyroidism    Seizures (HCC)    Past Surgical History:  Procedure Laterality Date   ABDOMINAL HYSTERECTOMY     BREAST SURGERY     THYROIDECTOMY  1988    Current Outpatient Medications  Medication Sig Dispense Refill   Alcohol Swabs (B-D SINGLE USE SWABS REGULAR) PADS See admin instructions.     amiodarone  (PACERONE ) 200 MG tablet Take 1 tablet (200 mg total) by mouth daily.     amLODipine  (NORVASC ) 10 MG tablet Take 10 mg by mouth daily.     apixaban  (ELIQUIS ) 5 MG TABS tablet Take 1 tablet (5 mg total) by mouth 2 (two) times daily. 180 tablet 1   atorvastatin  (LIPITOR) 80 MG tablet Take 80 mg by mouth daily.     Blood Glucose Calibration (TRUE METRIX LEVEL 1) Low SOLN      Blood Glucose Monitoring Suppl (TRUE METRIX METER) w/Device KIT      Calcium  Carbonate-Vit D-Min (CALTRATE 600+D PLUS MINERALS) 600-800 MG-UNIT TABS Take 1 tablet by mouth in the morning and  at bedtime.     ezetimibe (ZETIA) 10 MG tablet Take 10 mg by mouth daily.     furosemide (LASIX) 40 MG tablet Take 40 mg by mouth daily.     glucose blood (TRUE METRIX BLOOD GLUCOSE TEST) test strip Use to test your blood sugar     Glycerin-Polysorbate 80 (REFRESH DRY EYE THERAPY OP) Place 1 drop into both eyes daily.     ketoconazole  (NIZORAL ) 2 % cream Apply to both feet and between toes once daily until scales are gone. (Patient not taking: Reported on 03/15/2024) 60 g 2   levETIRAcetam  (KEPPRA ) 500 MG tablet Take 500 mg by mouth 2 (two) times daily.     levothyroxine  (SYNTHROID ) 150 MCG tablet Take 175 mcg by mouth daily before  breakfast.     losartan -hydrochlorothiazide (HYZAAR) 50-12.5 MG tablet Take 1 tablet by mouth daily.     metFORMIN  (GLUCOPHAGE ) 500 MG tablet Take 500 mg by mouth 2 (two) times daily with a meal.     metoprolol  succinate (TOPROL -XL) 100 MG 24 hr tablet Take 100 mg by mouth daily.     Multiple Vitamins-Minerals (CENTRUM SILVER 50+WOMEN) TABS Take 1 tablet by mouth daily.     potassium chloride  (KLOR-CON ) 10 MEQ tablet TAKE 2 TABLETS EVERY DAY 180 tablet 3   TRUEplus Lancets 33G MISC Use to test your blood sugar     No current facility-administered medications for this visit.    No Known Allergies  ROS- All systems are reviewed and negative except as per the HPI above  Physical Exam: There were no vitals filed for this visit.    Wt Readings from Last 3 Encounters:  03/15/24 108.4 kg  02/23/24 104 kg  02/15/24 100.7 kg    Labs: Lab Results  Component Value Date   NA 144 03/15/2024   K 3.9 03/15/2024   CL 104 03/15/2024   CO2 26 03/15/2024   GLUCOSE 108 (H) 03/15/2024   BUN 21 03/15/2024   CREATININE 1.37 (H) 03/15/2024   CALCIUM  9.6 03/15/2024   MG 1.4 (L) 02/15/2024   No results found for: INR No results found for: CHOL, HDL, LDLCALC, TRIG  GEN- The patient is well appearing, alert and oriented x 3 today.   Neck - no JVD or carotid bruit noted Lungs- Clear to ausculation bilaterally, normal work of breathing Heart- ***Regular rate and rhythm, no murmurs, rubs or gallops, PMI not laterally displaced Extremities- no clubbing, cyanosis, or edema Skin - no rash or ecchymosis noted   EKG- ***   Echo 01/28/22-  1. Left ventricular ejection fraction, by estimation, is 65 to 70%. Left  ventricular ejection fraction by 3D volume is 67 %. The left ventricle has  normal function. The left ventricle has no regional wall motion  abnormalities. There is mild left  ventricular hypertrophy. Left ventricular diastolic parameters are  consistent with Grade I diastolic  dysfunction (impaired relaxation).   2. Right ventricular systolic function is normal. The right ventricular  size is normal. There is mildly elevated pulmonary artery systolic  pressure. The estimated right ventricular systolic pressure is 36.4 mmHg.   3. Left atrial size was mildly dilated.   4. The mitral valve is grossly normal. Trivial mitral valve  regurgitation.   5. The aortic valve is tricuspid. Aortic valve regurgitation is not  visualized.   6. The inferior vena cava is normal in size with greater than 50%  respiratory variability, suggesting right atrial pressure of 3 mmHg.   Comparison(s): Changes  from prior study are noted. 12/07/2020: LVEF 60-65%,  grade 2 DD, elevated LVEDP.    Assessment and Plan:  1. Persistent Afib  Patient is currently in A-fib.  She was previously seen on 11/17 and scheduled for cardioversion on 11/21 but subsequently canceled the procedure due to being unsure about performing repeat cardioversion.  We discussed rate control strategy versus rhythm control strategy with amiodarone  and repeat cardioversion.  After discussion, patient wishes to ***.  CHA2DS2VASc score of at least 5 No missed doses of Eliquis .  High risk medication monitoring (ICD10: [id number]) Patient requires ongoing monitoring for anti-arrhythmic medication which has the potential to cause life threatening arrhythmias or AV block. Qtc stable. Continue amiodarone  200 mg daily.   Edema Continue Lasix 40 mg daily.  Continue potassium 20 mEq daily.     Follow up***.   Dorn Heinrich, PA-C  Afib Clinic [facility] [address] [place], [place] [address] [phone]

## 2024-04-07 ENCOUNTER — Ambulatory Visit: Attending: Cardiology | Admitting: Cardiology

## 2024-04-07 ENCOUNTER — Encounter: Payer: Self-pay | Admitting: Cardiology

## 2024-04-07 VITALS — BP 124/80 | HR 73 | Ht 68.0 in | Wt 231.6 lb

## 2024-04-07 DIAGNOSIS — I5031 Acute diastolic (congestive) heart failure: Secondary | ICD-10-CM | POA: Diagnosis not present

## 2024-04-07 DIAGNOSIS — E119 Type 2 diabetes mellitus without complications: Secondary | ICD-10-CM | POA: Diagnosis not present

## 2024-04-07 DIAGNOSIS — E782 Mixed hyperlipidemia: Secondary | ICD-10-CM

## 2024-04-07 DIAGNOSIS — D6869 Other thrombophilia: Secondary | ICD-10-CM | POA: Diagnosis not present

## 2024-04-07 DIAGNOSIS — I4819 Other persistent atrial fibrillation: Secondary | ICD-10-CM | POA: Diagnosis not present

## 2024-04-07 DIAGNOSIS — I10 Essential (primary) hypertension: Secondary | ICD-10-CM | POA: Diagnosis not present

## 2024-04-07 DIAGNOSIS — R600 Localized edema: Secondary | ICD-10-CM | POA: Diagnosis not present

## 2024-04-07 MED ORDER — TORSEMIDE 20 MG PO TABS: 20.0000 mg | ORAL_TABLET | Freq: Every day | ORAL | 1 refills | Status: DC

## 2024-04-07 NOTE — Patient Instructions (Addendum)
 Medication Instructions:   STOP amlodipine   STOP furosemide   START torsemide 20mg  daily  *If you need a refill on your cardiac medications before your next appointment, please call your pharmacy*  Lab Work: Non-Fasting lab work today -- first floor (BMET, magnesium  pro-BNP)  Non-fasting lab work in 1 week   If you have labs (blood work) drawn today and your tests are completely normal, you will receive your results only by: [employer] (if you have MyChart) OR A paper copy in the mail If you have any lab test that is abnormal or we need to change your treatment, we will call you to review the results.  Follow-Up: At [facility], you and your health needs are our priority.  As part of our continuing mission to provide you with exceptional heart care, our providers are all part of one team.  This team includes your primary Cardiologist (physician) and Advanced Practice Providers or APPs (Physician Assistants and Nurse Practitioners) who all work together to provide you with the care you need, when you need it.  Your next appointment:    4 weeks with Dr. Michele   Please follow up with your PCP regarding THYROID   We recommend signing up for the patient portal called MyChart.  Sign up information is provided on this After Visit Summary.  MyChart is used to connect with patients for Virtual Visits (Telemedicine).  Patients are able to view lab/test results, encounter notes, upcoming appointments, etc.  Non-urgent messages can be sent to your provider as well.   To learn more about what you can do with MyChart, go to [email or web address].   Other Instructions

## 2024-04-07 NOTE — Progress Notes (Signed)
 Cardiology Office Note:    NAME:  Beth Lawson    MRN: [id number] DOB:  [birth year]   PCP:  Marvene Prentice SAUNDERS, FNP  Former Cardiology Providers: None  Primary Cardiologist:  None, FACC (established care 04/07/2024) Electrophysiologist:  None   Referring MD: Marvene Prentice SAUNDERS, FNP  Reason of Consult: Atrial fibrillation  Chief Complaint  Patient presents with   Establish Care    Atrial fibrillation    History of Present Illness:    Beth Lawson is a 83 y.o. African-American female whose past medical history and cardiovascular risk factors includes: Persistent atrial fibrillation (history of cardioversion), type 2 diabetes, hypertension, hyperlipidemia, seizures, hypothyroidism.   She is being seen today for the evaluation of atrial fibrillation at the request of Marvene Prentice SAUNDERS, FNP.  Atrial fibrillation: Likely diagnosed back in June 2022 based on electronic medical records. Follows with atrial fibrillation clinic, last progress note from 03/15/2024 reviewed as part of today's visit. History of cardioversion in October 2025 in the ED for A-fib with RVR Started on amiodarone  post cardioversion secondary to ER AF but was not able to tolerate amiodarone  load. Patient stated she did not feel well. Therefore, it was reduced to qday.    Now referred to cardiology for further evaluation and management.  She has exertional shortness of breath that limits her walking and requires frequent rest, along with marked bilateral leg swelling for about a month that she attributes to fluid retention. The edema is severe enough to make lifting her legs difficult. Lasix 40 mg once daily has not improved the swelling. She has no new or worsening chest pain but has difficulty lying flat and prefers to sleep sitting up.   Her current medications include amiodarone  200 mg once daily, Eliquis  twice daily, Lipitor, Zetia, Lasix 40 mg once daily, levothyroxine , and Keppra . She has not had a seizure since  2019.  She has hypothyroidism treated with levothyroxine . Her thyroid  function has not been recently adjusted, and her last TSH in October was 17.3.  Current Medications: Current Meds  Medication Sig   Alcohol Swabs (B-D SINGLE USE SWABS REGULAR) PADS See admin instructions.   amiodarone  (PACERONE ) 200 MG tablet Take 1 tablet (200 mg total) by mouth daily.   apixaban  (ELIQUIS ) 5 MG TABS tablet Take 1 tablet (5 mg total) by mouth 2 (two) times daily.   atorvastatin  (LIPITOR) 80 MG tablet Take 80 mg by mouth daily.   Blood Glucose Calibration (TRUE METRIX LEVEL 1) Low SOLN    Blood Glucose Monitoring Suppl (TRUE METRIX METER) w/Device KIT    Calcium  Carbonate-Vit D-Min (CALTRATE 600+D PLUS MINERALS) 600-800 MG-UNIT TABS Take 1 tablet by mouth in the morning and at bedtime.   ezetimibe (ZETIA) 10 MG tablet Take 10 mg by mouth daily.   glucose blood (TRUE METRIX BLOOD GLUCOSE TEST) test strip Use to test your blood sugar   Glycerin-Polysorbate 80 (REFRESH DRY EYE THERAPY OP) Place 1 drop into both eyes daily.   levETIRAcetam  (KEPPRA ) 500 MG tablet Take 500 mg by mouth 2 (two) times daily.   levothyroxine  (SYNTHROID ) 150 MCG tablet Take 175 mcg by mouth daily before breakfast.   losartan -hydrochlorothiazide (HYZAAR) 50-12.5 MG tablet Take 1 tablet by mouth daily.   metFORMIN  (GLUCOPHAGE ) 500 MG tablet Take 500 mg by mouth 2 (two) times daily with a meal.   metoprolol  succinate (TOPROL -XL) 100 MG 24 hr tablet Take 100 mg by mouth daily.   Multiple Vitamins-Minerals (CENTRUM SILVER 50+WOMEN) TABS Take 1 tablet  by mouth daily.   potassium chloride  (KLOR-CON ) 10 MEQ tablet TAKE 2 TABLETS EVERY DAY   torsemide (DEMADEX) 20 MG tablet Take 1 tablet (20 mg total) by mouth daily.   TRUEplus Lancets 33G MISC Use to test your blood sugar   [DISCONTINUED] amLODipine  (NORVASC ) 10 MG tablet Take 10 mg by mouth daily.   [DISCONTINUED] furosemide (LASIX) 40 MG tablet Take 40 mg by mouth daily.      Allergies:    Patient has no known allergies.   Past Medical History: Past Medical History:  Diagnosis Date   Diabetes mellitus, type 2 (HCC)    HTN (hypertension)    Hyperlipidemia    Hypothyroidism    Seizures (HCC)     Past Surgical History: Past Surgical History:  Procedure Laterality Date   ABDOMINAL HYSTERECTOMY     BREAST SURGERY     THYROIDECTOMY  1988    Social History: Social History   Tobacco Use   Smoking status: Never   Smokeless tobacco: Never   Tobacco comments:    Never smoked 02/23/24  Substance Use Topics   Alcohol use: Never   Drug use: Never    Family History: No family history on file.  ROS:   Review of Systems  Constitutional: Positive for weight gain.  Cardiovascular:  Positive for dyspnea on exertion (chronic and stable), leg swelling, orthopnea and paroxysmal nocturnal dyspnea. Negative for chest pain, claudication, irregular heartbeat, near-syncope, palpitations and syncope.  Respiratory:  Positive for shortness of breath (chronic and stable).   Hematologic/Lymphatic: Negative for bleeding problem.    Studies Reviewed:   EKG: EKG Interpretation Date/Time:  Wednesday April 07 2024 14:00:47 EST Ventricular Rate:  102 PR Interval:    QRS Duration:  88 QT Interval:  368 QTC Calculation: 479 R Axis:   50  Text Interpretation: Atrial fibrillation CONTROLLED VENTRICULAR RESPONSE Low voltage QRS Consider Septal infarct (cited on or before 07-Apr-2024) When compared with ECG of 15-Mar-2024 09:07, No significant change was found Confirmed by Michele Richardson [phone]) on 04/07/2024 2:10:29 PM  Echocardiogram: 01/2022 LVEF 65 to 70%. No regional motion normalities. Grade 1 diastolic dysfunction. RVSP 36 mmHg, LAE mildly dilated. No significant valvular heart disease  Labs:    Latest Ref Rng & Units 03/15/2024   10:21 AM 02/15/2024   10:10 AM 12/26/2021    1:01 PM  CBC  WBC 3.4 - 10.8 x10E3/uL 7.2  6.7  6.3   Hemoglobin 11.1 -  15.9 g/dL 87.9  88.0  87.8   Hematocrit 34.0 - 46.6 % 37.1  37.3  38.2   Platelets 150 - 450 x10E3/uL 247  301  287        Latest Ref Rng & Units 04/07/2024    3:21 PM 03/15/2024   10:21 AM 02/15/2024   10:10 AM  BMP  Glucose 70 - 99 mg/dL 897  891  90   BUN 8 - 27 mg/dL 23  21  17    Creatinine 0.57 - 1.00 mg/dL 8.93  8.62  8.91   BUN/Creat Ratio 12 - 28 22  15     Sodium 134 - 144 mmol/L 146  144  141   Potassium 3.5 - 5.2 mmol/L 4.1  3.9  2.9   Chloride 96 - 106 mmol/L 105  104  103   CO2 20 - 29 mmol/L 25  26  22    Calcium  8.7 - 10.3 mg/dL 9.9  9.6  9.0       Latest Ref Rng & Units  04/07/2024    3:21 PM 03/15/2024   10:21 AM 02/15/2024   10:10 AM  CMP  Glucose 70 - 99 mg/dL 897  891  90   BUN 8 - 27 mg/dL 23  21  17    Creatinine 0.57 - 1.00 mg/dL 8.93  8.62  8.91   Sodium 134 - 144 mmol/L 146  144  141   Potassium 3.5 - 5.2 mmol/L 4.1  3.9  2.9   Chloride 96 - 106 mmol/L 105  104  103   CO2 20 - 29 mmol/L 25  26  22    Calcium  8.7 - 10.3 mg/dL 9.9  9.6  9.0     No results found for: CHOL, HDL, LDLCALC, LDLDIRECT, TRIG, CHOLHDL No results for input(s): LIPOA in the last 8760 hours. No components found for: NTPROBNP Recent Labs    04/07/24 1521  PROBNP 1,548*   Recent Labs    02/15/24 1010  TSH 17.315*    Physical Exam:    Today's Vitals   04/07/24 1358  BP: 124/80  Pulse: 73  SpO2: 97%  Weight: 231 lb 9.6 oz (105.1 kg)  Height: 5' 8 (1.727 m)  PainSc: 0-No pain   Body mass index is 35.21 kg/m. Wt Readings from Last 3 Encounters:  04/07/24 231 lb 9.6 oz (105.1 kg)  03/15/24 239 lb (108.4 kg)  02/23/24 229 lb 3.2 oz (104 kg)    Physical Exam  Constitutional: No distress.  hemodynamically stable  Neck: JVD present.  Cardiovascular: Normal rate, regular rhythm, S1 normal and S2 normal. Exam reveals no gallop, no S3 and no S4.  No murmur heard. Pulmonary/Chest: Effort normal and breath sounds normal. No stridor. She has no  wheezes. She has no rales.  Musculoskeletal:        General: Edema (+2 bilateral up to knees, warm to touch) present.     Cervical back: Neck supple.  Skin: Skin is warm.     Impression & Recommendation(s):  Impression:   ICD-10-CM   1. Acute heart failure with preserved ejection fraction (HFpEF) (HCC)  I50.31 torsemide (DEMADEX) 20 MG tablet    Basic Metabolic Panel (BMET)    Magnesium     Pro b natriuretic peptide (BNP)    Basic metabolic panel with GFR    Magnesium     Pro b natriuretic peptide (BNP)    2. Persistent atrial fibrillation (HCC)  I48.19 EKG 12-Lead    3. Hypercoagulable state due to persistent atrial fibrillation (HCC)  D68.69    I48.19     4. Benign hypertension  I10     5. Mixed hyperlipidemia  E78.2     6. Bilateral lower extremity edema  R60.0     7. Non-insulin dependent type 2 diabetes mellitus (HCC)  E11.9        Recommendation(s):  Acute heart failure with preserved ejection fraction (HFpEF) (HCC) NYHA class III. Volume overload on physical examination with signs of bilateral lower extremity swelling and JVP Discontinue Lasix as it has been ineffective, per patient Start torsemide 20 mg p.o. every morning Will obtain baseline labs- BMP, NT-proBNP, and Magnesium  level Also discontinue amlodipine  which would be causing lower extremity swelling. Patient is also hypothyroid, recommended that she follows up with PCP for further medication titration Patient wants to avoid hospitalization if possible but with my clinical suspicion is that it will be very difficult to diurese her with oral medications.  Will try torsemide orally for the next 48 to 72 hours.  If they  do not notice a significant change in urine output I recommended that they go to the ER for diuresis. Patient recently underwent cardioversion but had ER AF. Her atrial fibrillation is likely contributing to her heart failure exacerbation.  However to help maintain sinus rhythm I think  appropriate diuresis needs to be pursued prior to restoring sinus rhythm.  This was conveyed to both patient and daughter at today's visit. We also discussed what to be aware off that would prompt them to do to ER for IV diuresis.   Persistent atrial fibrillation (HCC) Rate control: Metoprolol  succinate 100 mg p.o. daily. Rhythm control: Amiodarone  100 mg p.o. daily. Thromboembolic prophylaxis: Eliquis  Status post cardioversion. Did not tolerate the amiodarone  load and therefore taking 200 mg p.o. daily.   Amiodarone  was started around February 23, 2024 Follows with atrial fibrillation clinic Recommend diuresis and optimizing heart failure status prior to cardioversion  Hypercoagulable state due to persistent atrial fibrillation (HCC) Risks, benefits, alternatives to anticoagulation discussed. Patient does not endorse evidence of bleeding.  Benign hypertension Office blood pressures are well-controlled. Medications as discussed above  Mixed hyperlipidemia Continue Lipitor 80 mg p.o. nightly and Zetia 10 mg p.o. daily  Bilateral lower extremity edema Likely secondary to acute HFrEF exacerbation Discontinue amlodipine . Cannot rule out lower extremity swelling secondary to myxedema from hypothyroidism, referred her back to PCP for thyroid  disease management  Non-insulin dependent type 2 diabetes mellitus (HCC) Currently on metformin . Monitor creatinine and GFR -carefully as she is on metformin . Continue lipid-lowering agents, ARB.   Orders Placed:  Orders Placed This Encounter  Procedures   Basic Metabolic Panel (BMET)   Magnesium    Pro b natriuretic peptide (BNP)   Basic metabolic panel with GFR   Magnesium    Pro b natriuretic peptide (BNP)   EKG 12-Lead     Final Medication List:    Meds ordered this encounter  Medications   torsemide (DEMADEX) 20 MG tablet    Sig: Take 1 tablet (20 mg total) by mouth daily.    Dispense:  90 tablet    Refill:  1    Medications  Discontinued During This Encounter  Medication Reason   amLODipine  (NORVASC ) 10 MG tablet Discontinued by provider   furosemide (LASIX) 40 MG tablet Discontinued by provider     Current Outpatient Medications:    Alcohol Swabs (B-D SINGLE USE SWABS REGULAR) PADS, See admin instructions., Disp: , Rfl:    amiodarone  (PACERONE ) 200 MG tablet, Take 1 tablet (200 mg total) by mouth daily., Disp: , Rfl:    apixaban  (ELIQUIS ) 5 MG TABS tablet, Take 1 tablet (5 mg total) by mouth 2 (two) times daily., Disp: 180 tablet, Rfl: 1   atorvastatin  (LIPITOR) 80 MG tablet, Take 80 mg by mouth daily., Disp: , Rfl:    Blood Glucose Calibration (TRUE METRIX LEVEL 1) Low SOLN, , Disp: , Rfl:    Blood Glucose Monitoring Suppl (TRUE METRIX METER) w/Device KIT, , Disp: , Rfl:    Calcium  Carbonate-Vit D-Min (CALTRATE 600+D PLUS MINERALS) 600-800 MG-UNIT TABS, Take 1 tablet by mouth in the morning and at bedtime., Disp: , Rfl:    ezetimibe (ZETIA) 10 MG tablet, Take 10 mg by mouth daily., Disp: , Rfl:    glucose blood (TRUE METRIX BLOOD GLUCOSE TEST) test strip, Use to test your blood sugar, Disp: , Rfl:    Glycerin-Polysorbate 80 (REFRESH DRY EYE THERAPY OP), Place 1 drop into both eyes daily., Disp: , Rfl:    levETIRAcetam  (KEPPRA ) 500 MG  tablet, Take 500 mg by mouth 2 (two) times daily., Disp: , Rfl:    levothyroxine  (SYNTHROID ) 150 MCG tablet, Take 175 mcg by mouth daily before breakfast., Disp: , Rfl:    losartan -hydrochlorothiazide (HYZAAR) 50-12.5 MG tablet, Take 1 tablet by mouth daily., Disp: , Rfl:    metFORMIN  (GLUCOPHAGE ) 500 MG tablet, Take 500 mg by mouth 2 (two) times daily with a meal., Disp: , Rfl:    metoprolol  succinate (TOPROL -XL) 100 MG 24 hr tablet, Take 100 mg by mouth daily., Disp: , Rfl:    Multiple Vitamins-Minerals (CENTRUM SILVER 50+WOMEN) TABS, Take 1 tablet by mouth daily., Disp: , Rfl:    potassium chloride  (KLOR-CON ) 10 MEQ tablet, TAKE 2 TABLETS EVERY DAY, Disp: 180 tablet, Rfl: 3    torsemide (DEMADEX) 20 MG tablet, Take 1 tablet (20 mg total) by mouth daily., Disp: 90 tablet, Rfl: 1   TRUEplus Lancets 33G MISC, Use to test your blood sugar, Disp: , Rfl:    ketoconazole  (NIZORAL ) 2 % cream, Apply to both feet and between toes once daily until scales are gone. (Patient not taking: Reported on 04/07/2024), Disp: 60 g, Rfl: 2  Consent:   N/A  Disposition:   4-week follow-up sooner if needed Patient may be asked to follow-up sooner based on the results of the above-mentioned testing.  I have reviewed prior notes from Afib clinic dated 03/15/2024  Discussed management of at least two chronic comorbid conditions.  I have independently interpreted results of the EKG 04/07/2024, labs dated 03/15/2024 , and reviewed the results under studies reviewed as part of medical decision making.  Patient education provided regarding her cardiovascular care.  Since patient is on oral anticoagulation we have discuss the risk,benefit, and alternatives and either reviewed pertinent labs or ordered labs to monitor safety.  Since patient is on antiarrhythmic drugs we discussed medication profile and follow up testing needed to monitor safety.  Prescription drug management including but not limited to: addition/titration/discontinuation of medical therapy, medication reconciliation, discussed potential side effects, follow up labs either reviewed or ordered for monitoring safety.  Labs ordered - see above.  I have independently formulated and discussed the assessment and plan with Ms.Selita Mcmeekin and her daughter.  This note will be shared with the patient's primary care provider to coordinate care.  Ms.Blu Usman will continue to follow her primary care provider for management of her other comorbid conditions.  Signed, Madonna Michele HAS, [facility]  A Division of [facility] [address] [place], [place] [address]

## 2024-04-08 ENCOUNTER — Encounter: Payer: Self-pay | Admitting: Cardiology

## 2024-04-08 ENCOUNTER — Ambulatory Visit: Payer: Self-pay | Admitting: Cardiology

## 2024-04-08 LAB — BASIC METABOLIC PANEL WITH GFR
BUN/Creatinine Ratio: 22 (ref 12–28)
BUN: 23 mg/dL (ref 8–27)
CO2: 25 mmol/L (ref 20–29)
Calcium: 9.9 mg/dL (ref 8.7–10.3)
Chloride: 105 mmol/L (ref 96–106)
Creatinine, Ser: 1.06 mg/dL — ABNORMAL HIGH (ref 0.57–1.00)
Glucose: 102 mg/dL — ABNORMAL HIGH (ref 70–99)
Potassium: 4.1 mmol/L (ref 3.5–5.2)
Sodium: 146 mmol/L — ABNORMAL HIGH (ref 134–144)
eGFR: 52 mL/min/1.73 — ABNORMAL LOW (ref >59)

## 2024-04-08 LAB — PRO B NATRIURETIC PEPTIDE: NT-Pro BNP: 1548 pg/mL — ABNORMAL HIGH (ref 0–738)

## 2024-04-08 LAB — MAGNESIUM: Magnesium: 1.8 mg/dL (ref 1.6–2.3)

## 2024-04-09 NOTE — Telephone Encounter (Signed)
 Spoke with patient and Daughter (per DPR). Relayed Dr. Tyree note. All concerns addressed.

## 2024-04-20 ENCOUNTER — Observation Stay (HOSPITAL_COMMUNITY)
Admission: EM | Admit: 2024-04-20 | Discharge: 2024-04-21 | Disposition: A | Discharge status: Home / Self Care | Attending: Family Medicine | Admitting: Family Medicine

## 2024-04-20 ENCOUNTER — Emergency Department (HOSPITAL_COMMUNITY)

## 2024-04-20 ENCOUNTER — Other Ambulatory Visit: Payer: Self-pay

## 2024-04-20 DIAGNOSIS — I5030 Unspecified diastolic (congestive) heart failure: Secondary | ICD-10-CM | POA: Insufficient documentation

## 2024-04-20 DIAGNOSIS — I10 Essential (primary) hypertension: Secondary | ICD-10-CM | POA: Diagnosis not present

## 2024-04-20 DIAGNOSIS — E890 Postprocedural hypothyroidism: Secondary | ICD-10-CM | POA: Diagnosis not present

## 2024-04-20 DIAGNOSIS — E782 Mixed hyperlipidemia: Secondary | ICD-10-CM | POA: Diagnosis present

## 2024-04-20 DIAGNOSIS — Z6832 Body mass index (BMI) 32.0-32.9, adult: Secondary | ICD-10-CM | POA: Diagnosis not present

## 2024-04-20 DIAGNOSIS — I480 Paroxysmal atrial fibrillation: Secondary | ICD-10-CM | POA: Diagnosis not present

## 2024-04-20 DIAGNOSIS — G40909 Epilepsy, unspecified, not intractable, without status epilepticus: Secondary | ICD-10-CM | POA: Insufficient documentation

## 2024-04-20 DIAGNOSIS — E876 Hypokalemia: Secondary | ICD-10-CM | POA: Diagnosis not present

## 2024-04-20 DIAGNOSIS — E119 Type 2 diabetes mellitus without complications: Secondary | ICD-10-CM

## 2024-04-20 DIAGNOSIS — E1142 Type 2 diabetes mellitus with diabetic polyneuropathy: Secondary | ICD-10-CM | POA: Diagnosis present

## 2024-04-20 DIAGNOSIS — I4819 Other persistent atrial fibrillation: Secondary | ICD-10-CM

## 2024-04-20 DIAGNOSIS — I130 Hypertensive heart and chronic kidney disease with heart failure and stage 1 through stage 4 chronic kidney disease, or unspecified chronic kidney disease: Secondary | ICD-10-CM | POA: Diagnosis not present

## 2024-04-20 DIAGNOSIS — N1831 Chronic kidney disease, stage 3a: Secondary | ICD-10-CM | POA: Diagnosis not present

## 2024-04-20 DIAGNOSIS — E1122 Type 2 diabetes mellitus with diabetic chronic kidney disease: Secondary | ICD-10-CM | POA: Diagnosis not present

## 2024-04-20 DIAGNOSIS — I110 Hypertensive heart disease with heart failure: Secondary | ICD-10-CM

## 2024-04-20 DIAGNOSIS — R799 Abnormal finding of blood chemistry, unspecified: Secondary | ICD-10-CM | POA: Diagnosis present

## 2024-04-20 DIAGNOSIS — Z79899 Other long term (current) drug therapy: Secondary | ICD-10-CM | POA: Insufficient documentation

## 2024-04-20 DIAGNOSIS — N179 Acute kidney failure, unspecified: Secondary | ICD-10-CM | POA: Diagnosis not present

## 2024-04-20 DIAGNOSIS — Z7901 Long term (current) use of anticoagulants: Secondary | ICD-10-CM | POA: Diagnosis not present

## 2024-04-20 DIAGNOSIS — I5031 Acute diastolic (congestive) heart failure: Secondary | ICD-10-CM

## 2024-04-20 DIAGNOSIS — I5032 Chronic diastolic (congestive) heart failure: Secondary | ICD-10-CM

## 2024-04-20 LAB — COMPREHENSIVE METABOLIC PANEL WITH GFR
ALT: 76 U/L — ABNORMAL HIGH (ref 0–44)
AST: 51 U/L — ABNORMAL HIGH (ref 15–41)
Albumin: 4.2 g/dL (ref 3.5–5.0)
Alkaline Phosphatase: 54 U/L (ref 38–126)
Anion gap: 16 — ABNORMAL HIGH (ref 5–15)
BUN: 38 mg/dL — ABNORMAL HIGH (ref 8–23)
CO2: 31 mmol/L (ref 22–32)
Calcium: 9.7 mg/dL (ref 8.9–10.3)
Chloride: 96 mmol/L — ABNORMAL LOW (ref 98–111)
Creatinine, Ser: 1.66 mg/dL — ABNORMAL HIGH (ref 0.44–1.00)
GFR, Estimated: 30 mL/min — ABNORMAL LOW
Glucose, Bld: 91 mg/dL (ref 70–99)
Potassium: 3.2 mmol/L — ABNORMAL LOW (ref 3.5–5.1)
Sodium: 143 mmol/L (ref 135–145)
Total Bilirubin: 0.7 mg/dL (ref 0.0–1.2)
Total Protein: 6.8 g/dL (ref 6.5–8.1)

## 2024-04-20 LAB — I-STAT CHEM 8, ED
BUN: 37 mg/dL — ABNORMAL HIGH (ref 8–23)
Calcium, Ion: 1 mmol/L — ABNORMAL LOW (ref 1.15–1.40)
Chloride: 96 mmol/L — ABNORMAL LOW (ref 98–111)
Creatinine, Ser: 2 mg/dL — ABNORMAL HIGH (ref 0.44–1.00)
Glucose, Bld: 99 mg/dL (ref 70–99)
HCT: 39 % (ref 36.0–46.0)
Hemoglobin: 13.3 g/dL (ref 12.0–15.0)
Potassium: 2.9 mmol/L — ABNORMAL LOW (ref 3.5–5.1)
Sodium: 142 mmol/L (ref 135–145)
TCO2: 33 mmol/L — ABNORMAL HIGH (ref 22–32)

## 2024-04-20 LAB — CBC WITH DIFFERENTIAL/PLATELET
Abs Immature Granulocytes: 0.01 K/uL (ref 0.00–0.07)
Basophils Absolute: 0 K/uL (ref 0.0–0.1)
Basophils Relative: 1 %
Eosinophils Absolute: 0 K/uL (ref 0.0–0.5)
Eosinophils Relative: 1 %
HCT: 39.6 % (ref 36.0–46.0)
Hemoglobin: 12.6 g/dL (ref 12.0–15.0)
Immature Granulocytes: 0 %
Lymphocytes Relative: 36 %
Lymphs Abs: 1.5 K/uL (ref 0.7–4.0)
MCH: 29.4 pg (ref 26.0–34.0)
MCHC: 31.8 g/dL (ref 30.0–36.0)
MCV: 92.5 fL (ref 80.0–100.0)
Monocytes Absolute: 0.4 K/uL (ref 0.1–1.0)
Monocytes Relative: 9 %
Neutro Abs: 2.2 K/uL (ref 1.7–7.7)
Neutrophils Relative %: 53 %
Platelets: 253 K/uL (ref 150–400)
RBC: 4.28 MIL/uL (ref 3.87–5.11)
RDW: 16.6 % — ABNORMAL HIGH (ref 11.5–15.5)
WBC: 4.2 K/uL (ref 4.0–10.5)
nRBC: 0 % (ref 0.0–0.2)

## 2024-04-20 LAB — PRO B NATRIURETIC PEPTIDE: NT-Pro BNP: 2145 pg/mL — ABNORMAL HIGH (ref 0–738)

## 2024-04-20 LAB — BASIC METABOLIC PANEL WITH GFR
BUN/Creatinine Ratio: 20 (ref 12–28)
BUN: 38 mg/dL — ABNORMAL HIGH (ref 8–27)
CO2: 31 mmol/L — ABNORMAL HIGH (ref 20–29)
Calcium: 9.7 mg/dL (ref 8.7–10.3)
Chloride: 96 mmol/L (ref 96–106)
Creatinine, Ser: 1.93 mg/dL — ABNORMAL HIGH (ref 0.57–1.00)
Glucose: 99 mg/dL (ref 70–99)
Potassium: 3.3 mmol/L — AB (ref 3.5–5.2)
Sodium: 146 mmol/L — ABNORMAL HIGH (ref 134–144)
eGFR: 25 mL/min/1.73 — ABNORMAL LOW

## 2024-04-20 LAB — MAGNESIUM
Magnesium: 1.4 mg/dL — ABNORMAL LOW (ref 1.6–2.3)
Magnesium: 1.4 mg/dL — ABNORMAL LOW (ref 1.7–2.4)

## 2024-04-20 MED ORDER — LEVETIRACETAM 500 MG PO TABS
500.0000 mg | ORAL_TABLET | Freq: Two times a day (BID) | ORAL | Status: DC
  Administered 2024-04-20 – 2024-04-21 (×2): 500 mg via ORAL
  Filled 2024-04-20 (×2): qty 1

## 2024-04-20 MED ORDER — SODIUM CHLORIDE 0.9 % IV SOLN: INTRAVENOUS | Status: DC

## 2024-04-20 MED ORDER — POTASSIUM CHLORIDE 20 MEQ PO PACK
60.0000 meq | PACK | Freq: Once | ORAL | Status: AC
  Administered 2024-04-20: 60 meq via ORAL
  Filled 2024-04-20: qty 3

## 2024-04-20 MED ORDER — ATORVASTATIN CALCIUM 80 MG PO TABS
80.0000 mg | ORAL_TABLET | Freq: Every day | ORAL | Status: DC
  Administered 2024-04-21: 80 mg via ORAL
  Filled 2024-04-20: qty 1

## 2024-04-20 MED ORDER — AMIODARONE HCL 200 MG PO TABS
200.0000 mg | ORAL_TABLET | Freq: Every day | ORAL | Status: DC
  Administered 2024-04-21: 200 mg via ORAL
  Filled 2024-04-20: qty 1

## 2024-04-20 MED ORDER — SODIUM CHLORIDE 0.9% FLUSH
3.0000 mL | Freq: Two times a day (BID) | INTRAVENOUS | Status: DC
  Administered 2024-04-21 (×2): 3 mL via INTRAVENOUS

## 2024-04-20 MED ORDER — EZETIMIBE 10 MG PO TABS
10.0000 mg | ORAL_TABLET | Freq: Every day | ORAL | Status: DC
  Administered 2024-04-21: 10 mg via ORAL
  Filled 2024-04-20: qty 1

## 2024-04-20 MED ORDER — MAGNESIUM OXIDE -MG SUPPLEMENT 400 (240 MG) MG PO TABS
400.0000 mg | ORAL_TABLET | Freq: Once | ORAL | Status: AC
  Administered 2024-04-20: 400 mg via ORAL
  Filled 2024-04-20: qty 1

## 2024-04-20 MED ORDER — ACETAMINOPHEN 325 MG PO TABS: 650.0000 mg | ORAL_TABLET | Freq: Four times a day (QID) | ORAL | Status: DC | PRN

## 2024-04-20 MED ORDER — SODIUM CHLORIDE 0.9 % IV BOLUS
1000.0000 mL | Freq: Once | INTRAVENOUS | Status: AC
  Administered 2024-04-20: 1000 mL via INTRAVENOUS

## 2024-04-20 MED ORDER — ACETAMINOPHEN 650 MG RE SUPP: 650.0000 mg | Freq: Four times a day (QID) | RECTAL | Status: DC | PRN

## 2024-04-20 MED ORDER — APIXABAN 2.5 MG PO TABS
2.5000 mg | ORAL_TABLET | Freq: Two times a day (BID) | ORAL | Status: DC
  Administered 2024-04-20 – 2024-04-21 (×2): 2.5 mg via ORAL
  Filled 2024-04-20 (×2): qty 1

## 2024-04-20 MED ORDER — APIXABAN 5 MG PO TABS: 5.0000 mg | ORAL_TABLET | Freq: Two times a day (BID) | ORAL | Status: DC

## 2024-04-20 MED ORDER — LEVOTHYROXINE SODIUM 75 MCG PO TABS
175.0000 ug | ORAL_TABLET | Freq: Every day | ORAL | Status: DC
  Administered 2024-04-21: 175 ug via ORAL
  Filled 2024-04-20: qty 1

## 2024-04-20 MED ORDER — POLYETHYLENE GLYCOL 3350 17 G PO PACK: 17.0000 g | PACK | Freq: Every day | ORAL | Status: DC | PRN

## 2024-04-20 MED ORDER — METFORMIN HCL 500 MG PO TABS: 500.0000 mg | ORAL_TABLET | Freq: Two times a day (BID) | ORAL | Status: DC

## 2024-04-20 MED ORDER — METOPROLOL SUCCINATE ER 100 MG PO TB24
100.0000 mg | ORAL_TABLET | Freq: Every day | ORAL | Status: DC
  Administered 2024-04-21: 100 mg via ORAL
  Filled 2024-04-20: qty 1

## 2024-04-20 MED ORDER — MAGNESIUM SULFATE 2 GM/50ML IV SOLN
2.0000 g | Freq: Once | INTRAVENOUS | Status: AC
  Administered 2024-04-20: 2 g via INTRAVENOUS
  Filled 2024-04-20: qty 50

## 2024-04-20 NOTE — ED Provider Notes (Addendum)
 " [place] EMERGENCY DEPARTMENT AT [facility] Provider Note   CSN: [id number] Arrival date & time: 04/20/24  1145     Patient presents with: Abnormal Lab   Beth Lawson is a 83 y.o. female.    Abnormal Lab    83 year old female with medical history significant for obesity, type 2 diabetes, HTN, HLD, CKD, atrial fibrillation on Eliquis  presenting to the emergency department with concern for AKI.  The patient states that her kidney function was diminished on outpatient laboratory evaluation.  Her creatinine was drawn on 12/22 and was found to be 1.93.  Baseline appears to be between 1 and 1.3.  She states that she has been eating and drinking normally, denies any vomiting.  She is on hydrochlorothiazide as a diuretic outpatient.  She is chronically on potassium supplementation.  She denies any abdominal pain, denies any dysuria or frequency, no fever or chills.  Prior to Admission medications  Medication Sig Start Date End Date Taking? Authorizing Provider  Alcohol Swabs (B-D SINGLE USE SWABS REGULAR) PADS See admin instructions. 02/08/21   [provider]  amiodarone  (PACERONE ) 200 MG tablet Take 1 tablet (200 mg total) by mouth daily. 03/01/24   Terra Fairy PARAS, PA-C  apixaban  (ELIQUIS ) 5 MG TABS tablet Take 1 tablet (5 mg total) by mouth 2 (two) times daily. 10/15/22 04/07/24  Terra Fairy PARAS, PA-C  atorvastatin  (LIPITOR) 80 MG tablet Take 80 mg by mouth daily.    [provider]  Blood Glucose Calibration (TRUE METRIX LEVEL 1) Low SOLN  02/08/21   [provider]  Blood Glucose Monitoring Suppl (TRUE METRIX METER) w/Device KIT  02/08/21   [provider]  Calcium  Carbonate-Vit D-Min (CALTRATE 600+D PLUS MINERALS) 600-800 MG-UNIT TABS Take 1 tablet by mouth in the morning and at bedtime.    [provider]  ezetimibe  (ZETIA ) 10 MG tablet Take 10 mg by mouth daily.    [provider]  glucose blood (TRUE METRIX BLOOD  GLUCOSE TEST) test strip Use to test your blood sugar 02/07/21   [provider]  Glycerin-Polysorbate 80 (REFRESH DRY EYE THERAPY OP) Place 1 drop into both eyes daily.    [provider]  ketoconazole  (NIZORAL ) 2 % cream Apply to both feet and between toes once daily until scales are gone. Patient not taking: Reported on 04/07/2024 12/04/22   Gaynel Delon CROME, [name]  levETIRAcetam  (KEPPRA ) 500 MG tablet Take 500 mg by mouth 2 (two) times daily.    [provider]  levothyroxine  (SYNTHROID ) 150 MCG tablet Take 175 mcg by mouth daily before breakfast. 05/26/20   [provider]  losartan -hydrochlorothiazide (HYZAAR) 50-12.5 MG tablet Take 1 tablet by mouth daily.    [provider]  metFORMIN  (GLUCOPHAGE ) 500 MG tablet Take 500 mg by mouth 2 (two) times daily with a meal.    [provider]  metoprolol  succinate (TOPROL -XL) 100 MG 24 hr tablet Take 100 mg by mouth daily.    [provider]  Multiple Vitamins-Minerals (CENTRUM SILVER 50+WOMEN) TABS Take 1 tablet by mouth daily.    [provider]  potassium chloride  (KLOR-CON ) 10 MEQ tablet TAKE 2 TABLETS EVERY DAY 05/05/23   Terra Fairy PARAS, PA-C  torsemide  (DEMADEX ) 20 MG tablet Take 1 tablet (20 mg total) by mouth daily. 04/07/24   Michele, Sunit, DO  TRUEplus Lancets 33G MISC Use to test your blood sugar 02/07/21   [provider]    Allergies: Patient has no known allergies.  Review of Systems  All other systems reviewed and are negative.   Updated Vital Signs BP (!) 151/95 (BP Location: Left Arm)   Pulse 83   Temp 97.8 F (36.6 C)   Resp 18   Ht 5' 8 (1.727 m)   Wt 96.8 kg   SpO2 97%   BMI 32.43 kg/m   Physical Exam Vitals and nursing note reviewed.  Constitutional:      General: She is not in acute distress.    Appearance: She is well-developed.  HENT:     Head: Normocephalic and atraumatic.     Mouth/Throat:     Mouth: Mucous membranes are  moist.  Eyes:     Conjunctiva/sclera: Conjunctivae normal.  Cardiovascular:     Rate and Rhythm: Normal rate and regular rhythm.  Pulmonary:     Effort: Pulmonary effort is normal. No respiratory distress.     Breath sounds: Normal breath sounds.  Abdominal:     Palpations: Abdomen is soft.     Tenderness: There is no abdominal tenderness.  Musculoskeletal:        General: No swelling.     Cervical back: Neck supple.  Skin:    General: Skin is warm and dry.     Capillary Refill: Capillary refill takes less than 2 seconds.  Neurological:     General: No focal deficit present.     Mental Status: She is alert. Mental status is at baseline.  Psychiatric:        Mood and Affect: Mood normal.     (all labs ordered are listed, but only abnormal results are displayed) Labs Reviewed  CBC WITH DIFFERENTIAL/PLATELET - Abnormal; Notable for the following components:      Result Value   RDW 16.6 (*)    All other components within normal limits  COMPREHENSIVE METABOLIC PANEL WITH GFR - Abnormal; Notable for the following components:   Potassium 3.2 (*)    Chloride 96 (*)    BUN 38 (*)    Creatinine, Ser 1.66 (*)    AST 51 (*)    ALT 76 (*)    GFR, Estimated 30 (*)    Anion gap 16 (*)    All other components within normal limits  MAGNESIUM  - Abnormal; Notable for the following components:   Magnesium  1.4 (*)    All other components within normal limits  I-STAT CHEM 8, ED - Abnormal; Notable for the following components:   Potassium 2.9 (*)    Chloride 96 (*)    BUN 37 (*)    Creatinine, Ser 2.00 (*)    Calcium , Ion 1.00 (*)    TCO2 33 (*)    All other components within normal limits    EKG: EKG Interpretation Date/Time:  Tuesday April 20 2024 13:36:55 EST Ventricular Rate:  93 PR Interval:    QRS Duration:  80 QT Interval:  298 QTC Calculation: 370 R Axis:   77  Text Interpretation: Atrial flutter with variable A-V block Low voltage QRS Cannot rule out Anterior  infarct , age undetermined Abnormal ECG When compared with ECG of 07-Apr-2024 14:00, PREVIOUS ECG IS PRESENT Confirmed by Jerrol Agent ([id number]) on 04/20/2024 2:50:14 PM  Radiology: No results found.   Procedures   Medications Ordered in the ED  sodium chloride  0.9 % bolus 1,000 mL (has no administration in time range)  magnesium  sulfate IVPB 2 g 50 mL (has no administration in time range)  magnesium  oxide (MAG-OX) tablet 400 mg (has no administration  in time range)                                    Medical Decision Making Risk OTC drugs. Prescription drug management. Decision regarding hospitalization.    83 year old female with medical history significant for obesity, type 2 diabetes, HTN, HLD, CKD, atrial fibrillation on Eliquis  presenting to the emergency department with concern for AKI.  The patient states that her kidney function was diminished on outpatient laboratory evaluation.  Her creatinine was drawn on 12/22 and was found to be 1.93.  Baseline appears to be between 1 and 1.3.  She states that she has been eating and drinking normally, denies any vomiting.  She is on hydrochlorothiazide as a diuretic outpatient.  She is chronically on potassium supplementation.  She denies any abdominal pain, denies any dysuria or frequency, no fever or chills.  On arrival, the patient was afebrile, not tachycardic or tachypneic, BP 151/95, saturating 97% on room air.  Patient presenting with minimal symptoms, concern for AKI on laboratory evaluation.  Patient has been on diuretics outpatient, had recently completed a course of torsemide  and is also on HCTZ outpatient.  She denies any chest pain or shortness of breath.  She had been having leg swelling which has improved.  She was sent to the emergency department by Dr. Jodine of her cardiologist for further evaluation in the setting of AKI.  On exam, the patient has moist mucous membranes, she is neuro intact, denies any abdominal pain or  discomfort.  EKG: Atrial flutter with variable AV block, ventricular rate 93 rate controlled, no acute ischemic changes.  Labs: CMP with AKI with a creatinine of 1.66, BUN of 38, potassium mildly hypokalemic 3.2, CBC without a leukocytosis or anemia, magnesium  hypomagnesemic to 1.4.  Chest x-ray obtained in triage process: IMPRESSION:  1. No acute cardiopulmonary abnormality.  2. Similar appearance of the vague, 5 mm nodular opacities in both lung bases.  Nonemergent chest CT again recommended for further characterization.  .  The patient was administered an IV fluid bolus for volume resuscitation in the setting of AKI, likely due to her outpatient diuretic use.  Plan for inpatient admission for observation given her electrolyte abnormalities and AKI.  Medicine consulted for admission, Dr. Melvin accepting.       Final diagnoses:  AKI (acute kidney injury)  Hypomagnesemia  Hypokalemia    ED Discharge Orders     None              Jerrol Agent, MD 04/20/24 1609    Jerrol Agent, MD 04/20/24 1620  "

## 2024-04-20 NOTE — Telephone Encounter (Signed)
 Daughter Everlina) returned staff call regarding results.

## 2024-04-20 NOTE — Progress Notes (Signed)
 Darrell,   Please follow up.   I called the patient this morning due to abnormal labs.  She is less Shortness of breath and has lost weight - but now has AKI, multiple electrolyte abnormalities, and rising BNP despite good diuresis. I have asked her to come to ER for further evaluation.   She was going to wait until her daughter wakes up to bring her in.   Dr. Ovila Lepage

## 2024-04-20 NOTE — ED Provider Triage Note (Signed)
 Emergency Medicine Provider Triage Evaluation Note  Beth Lawson , a 83 y.o. female  was evaluated in triage.  Pt complains of abnormal labs from cardiology office.  Reports abnormal kidney function and electrolytes.  Recently completed a course of torsemide  with improvement in fluid status.  States the leg swelling is significantly improved currently.  No soreness of breath or chest pain.   Review of Systems  Positive: As above Negative: As above  Physical Exam  BP (!) 151/95 (BP Location: Left Arm)   Pulse 83   Temp 97.8 F (36.6 C)   Resp 18   Ht 5' 8 (1.727 m)   Wt 96.8 kg   SpO2 97%   BMI 32.43 kg/m  Gen:   Awake, no distress   Resp:  Normal effort  MSK:   Moves extremities without difficulty  Other:    Medical Decision Making  Medically screening exam initiated at 12:53 PM.  Appropriate orders placed.  Beth Lawson was informed that the remainder of the evaluation will be completed by another provider, this initial triage assessment does not replace that evaluation, and the importance of remaining in the ED until their evaluation is complete.    Beth Loge, PA-C 04/20/24 1254

## 2024-04-20 NOTE — ED Notes (Signed)
 Pt state she would not get an IV till someone helped her with the restroom, but she would also like female toiletry care. Female staff asked to assist pt, stating staff will assist her when able.

## 2024-04-20 NOTE — ED Triage Notes (Signed)
 Pt was told to come to ED due to AKI and abnormal labs. Creatnine on labs drawn 12/22 is 1.93 and BNP 2145.

## 2024-04-20 NOTE — H&P (Signed)
 " History and Physical   Beth Lawson FMW:[id number] DOB: 10-07-1940 DOA: 04/20/2024  PCP: Marvene Prentice SAUNDERS, FNP   Patient coming from: Home  Chief Complaint: Abnormal lab  HPI: Beth Lawson is a 83 y.o. female with medical history significant of hypertension, hyperlipidemia, diabetes, CKD 3A, hypothyroidism, paroxysmal atrial fibrillation, seizures, obesity presenting with abnormal lab.  Patient presenting with abnormal creatinine when checked by cardiology office outpatient.  Creatinine elevated to 1.9 from baseline of 1.0-1.2.  Patient did recently have her diuretic increased for 3 days from 12/17-12/20.  Patient states she has been eating and drinking normally.  Patient [place] fevers, chills, chest pain or shortness of breath, abdominal pain, constipation, diarrhea, nausea, vomiting   ED Course: Vital signs in the ED notable for blood pressure in 150 systolic.  Lab workup included CMP with potassium 3.2, chloride 96, BUN 30, creatinine elevated 1.66 and baseline 1.0-1.2.  Normal bicarb and gap of 16.  Magnesium  1.4.  AST 51, ALT 76.  CBC within normal limits.  Chest x-ray with stable nodular opacities but no other acute abnormality.  Patient received p.o. magnesium  and 2 g magnesium  IV.  1 L IV fluids also ordered in the ED.  Review of Systems: As per HPI otherwise all other systems reviewed and are negative.  Past Medical History:  Diagnosis Date   Diabetes mellitus, type 2 (HCC)    HTN (hypertension)    Hyperlipidemia    Hypothyroidism    Seizures (HCC)     Past Surgical History:  Procedure Laterality Date   ABDOMINAL HYSTERECTOMY     BREAST SURGERY     THYROIDECTOMY  1988    Social History  reports that she has never smoked. She has never used smokeless tobacco. She reports that she does not drink alcohol and does not use drugs.  Allergies[1]  No family history on file.  Prior to Admission medications  Medication Sig Start Date End Date Taking? Authorizing Provider   Alcohol Swabs (B-D SINGLE USE SWABS REGULAR) PADS See admin instructions. 02/08/21   [provider]  amiodarone  (PACERONE ) 200 MG tablet Take 1 tablet (200 mg total) by mouth daily. 03/01/24   Terra Fairy PARAS, PA-C  apixaban  (ELIQUIS ) 5 MG TABS tablet Take 1 tablet (5 mg total) by mouth 2 (two) times daily. 10/15/22 04/07/24  Terra Fairy PARAS, PA-C  atorvastatin  (LIPITOR) 80 MG tablet Take 80 mg by mouth daily.    [provider]  Blood Glucose Calibration (TRUE METRIX LEVEL 1) Low SOLN  02/08/21   [provider]  Blood Glucose Monitoring Suppl (TRUE METRIX METER) w/Device KIT  02/08/21   [provider]  Calcium  Carbonate-Vit D-Min (CALTRATE 600+D PLUS MINERALS) 600-800 MG-UNIT TABS Take 1 tablet by mouth in the morning and at bedtime.    [provider]  ezetimibe  (ZETIA ) 10 MG tablet Take 10 mg by mouth daily.    [provider]  glucose blood (TRUE METRIX BLOOD GLUCOSE TEST) test strip Use to test your blood sugar 02/07/21   [provider]  Glycerin-Polysorbate 80 (REFRESH DRY EYE THERAPY OP) Place 1 drop into both eyes daily.    [provider]  ketoconazole  (NIZORAL ) 2 % cream Apply to both feet and between toes once daily until scales are gone. Patient not taking: Reported on 04/07/2024 12/04/22   Gaynel Delon CROME, [name]  levETIRAcetam  (KEPPRA ) 500 MG tablet Take 500 mg by mouth 2 (two) times daily.    [provider]  levothyroxine  (SYNTHROID ) 150  MCG tablet Take 175 mcg by mouth daily before breakfast. 05/26/20   [provider]  losartan -hydrochlorothiazide (HYZAAR) 50-12.5 MG tablet Take 1 tablet by mouth daily.    [provider]  metFORMIN  (GLUCOPHAGE ) 500 MG tablet Take 500 mg by mouth 2 (two) times daily with a meal.    [provider]  metoprolol  succinate (TOPROL -XL) 100 MG 24 hr tablet Take 100 mg by mouth daily.    [provider]  Multiple Vitamins-Minerals  (CENTRUM SILVER 50+WOMEN) TABS Take 1 tablet by mouth daily.    [provider]  potassium chloride  (KLOR-CON ) 10 MEQ tablet TAKE 2 TABLETS EVERY DAY 05/05/23   Terra Fairy PARAS, PA-C  torsemide  (DEMADEX ) 20 MG tablet Take 1 tablet (20 mg total) by mouth daily. 04/07/24   Michele, Sunit, DO  TRUEplus Lancets 33G MISC Use to test your blood sugar 02/07/21   [provider]    Physical Exam: Vitals:   04/20/24 1148 04/20/24 1251  BP: (!) 151/95   Pulse: 83   Resp: 18   Temp: 97.8 F (36.6 C)   SpO2: 97%   Weight:  96.8 kg  Height:  5' 8 (1.727 m)    Physical Exam Constitutional:      General: She is not in acute distress.    Appearance: Normal appearance.  HENT:     Head: Normocephalic and atraumatic.     Mouth/Throat:     Mouth: Mucous membranes are moist.     Pharynx: Oropharynx is clear.  Eyes:     Extraocular Movements: Extraocular movements intact.     Pupils: Pupils are equal, round, and reactive to light.  Cardiovascular:     Rate and Rhythm: Normal rate and regular rhythm.     Pulses: Normal pulses.     Heart sounds: Normal heart sounds.  Pulmonary:     Effort: Pulmonary effort is normal. No respiratory distress.     Breath sounds: Normal breath sounds.  Abdominal:     General: Bowel sounds are normal. There is no distension.     Palpations: Abdomen is soft.     Tenderness: There is no abdominal tenderness.  Musculoskeletal:        General: No swelling or deformity.  Skin:    General: Skin is warm and dry.  Neurological:     General: No focal deficit present.     Mental Status: Mental status is at baseline.    Labs on Admission: I have personally reviewed following labs and imaging studies  CBC: Recent Labs  Lab 04/20/24 1305 04/20/24 1322  WBC 4.2  --   NEUTROABS 2.2  --   HGB 12.6 13.3  HCT 39.6 39.0  MCV 92.5  --   PLT 253  --     Basic Metabolic Panel: Recent Labs  Lab 04/19/24 0813 04/20/24 1305 04/20/24 1322  NA 146*  143 142  K 3.3* 3.2* 2.9*  CL 96 96* 96*  CO2 31* 31  --   GLUCOSE 99 91 99  BUN 38* 38* 37*  CREATININE 1.93* 1.66* 2.00*  CALCIUM  9.7 9.7  --   MG 1.4* 1.4*  --     GFR: Estimated Creatinine Clearance: 25.5 mL/min (A) (by C-G formula based on SCr of 2 mg/dL (H)).  Liver Function Tests: Recent Labs  Lab 04/20/24 1305  AST 51*  ALT 76*  ALKPHOS 54  BILITOT 0.7  PROT 6.8  ALBUMIN 4.2    Urine analysis: No results found for: COLORURINE,  APPEARANCEUR, LABSPEC, PHURINE, GLUCOSEU, HGBUR, BILIRUBINUR, KETONESUR, PROTEINUR, UROBILINOGEN, NITRITE, LEUKOCYTESUR  Radiological Exams on Admission: DG Chest 1 View Result Date: 04/20/2024 EXAM: 1 VIEW(S) XRAY OF THE CHEST 04/20/2024 01:19:00 PM COMPARISON: 02/15/2024 CLINICAL HISTORY: Pulmonary edema FINDINGS: LUNGS AND PLEURA: Stable bilateral pulmonary nodules. No pleural effusion. No pneumothorax. HEART AND MEDIASTINUM: Cardiomegaly. Aortic calcification. BONES AND SOFT TISSUES: Thoracic degenerative changes. No acute osseous abnormality. IMPRESSION: 1. No acute cardiopulmonary abnormality. 2. Similar appearance of the vague, 5 mm nodular opacities in both lung bases. Nonemergent chest CT again recommended for further characterization. Electronically signed by: Rogelia Myers MD 04/20/2024 03:22 PM EST RP Workstation: [id number]   EKG: Independently reviewed.  Atrial flutter at 93 bpm.  Nonspecific T wave changes.  Assessment/Plan Principal Problem:   Acute renal failure superimposed on stage 3a chronic kidney disease, unspecified acute renal failure type (HCC) Active Problems:   Diabetic peripheral neuropathy associated with type 2 diabetes mellitus (HCC)   Essential hypertension   Mixed hyperlipidemia   Morbid obesity (HCC)   Postoperative hypothyroidism   Seizure disorder (HCC)   Chronic kidney disease, stage 3a (HCC)   Paroxysmal atrial fibrillation (HCC)   AKI on CKD 3 Hypokalemia Hypomagnesemia >  Creatinine elevated to 1.66 from baseline 1.0-1.2.  > Potassium 3.2.  Magnesium  1.4. > In setting of recent transient increase in home diuretic use. > Received 1 L IV fluids in the ED. - Monitor on MedSurg overnight - Continue IV fluids - 60 mEq p.o. potassium - Hold home losartan -hydrochlorothiazide and torsemide  - Trend renal function and electrolytes  Hypertension - Holding losartan -hydrochlorothiazide and torsemide  in the setting of AKI as above  Continue metoprolol   Hyperlipidemia - Continue home atorvastatin , Zetia   Diabetes - Continue home metformin   Hypothyroidism - Continue Synthroid   Paroxysmal defibrillation - Continue amiodarone  and metoprolol  - Continue Eliquis   History of seizure disorder - Continue home Keppra   Obesity - Noted  DVT prophylaxis: Eliquis  Code Status:   Full Family Communication:  Updated at bedside  Disposition Plan:   Patient is from:  Home  Anticipated DC to:  Home  Anticipated DC date:  1 to 2 days  Anticipated DC barriers: None  Consults called:  None Admission status:  Observation, MedSurg  Severity of Illness: The appropriate patient status for this patient is OBSERVATION. Observation status is judged to be reasonable and necessary in order to provide the required intensity of service to ensure the patient's safety. The patient's presenting symptoms, physical exam findings, and initial radiographic and laboratory data in the context of their medical condition is felt to place them at decreased risk for further clinical deterioration. Furthermore, it is anticipated that the patient will be medically stable for discharge from the hospital within 2 midnights of admission.    Marsa KATHEE Scurry MD [facility]  How to contact the [facility] Attending or Consulting provider 7A - 7P or covering provider during after hours 7P -7A, for this patient?   Check the care team in [facility] and look for a) attending/consulting [facility] provider listed  and b) the [facility] team listed Log into [email or web address] and use [facility]'s universal password to access. If you do not have the password, please contact the hospital operator. Locate the [facility] provider you are looking for under [facility] and page to a number that you can be directly reached. If you still have difficulty reaching the provider, please page the [facility] (Director on Call) for the Hospitalists listed on amion for assistance.  04/20/2024, 4:16 PM       [  1] No Known Allergies  "

## 2024-04-21 ENCOUNTER — Observation Stay (HOSPITAL_COMMUNITY)

## 2024-04-21 DIAGNOSIS — I5032 Chronic diastolic (congestive) heart failure: Secondary | ICD-10-CM

## 2024-04-21 DIAGNOSIS — I110 Hypertensive heart disease with heart failure: Secondary | ICD-10-CM

## 2024-04-21 DIAGNOSIS — E119 Type 2 diabetes mellitus without complications: Secondary | ICD-10-CM

## 2024-04-21 DIAGNOSIS — I4819 Other persistent atrial fibrillation: Secondary | ICD-10-CM

## 2024-04-21 DIAGNOSIS — I5031 Acute diastolic (congestive) heart failure: Secondary | ICD-10-CM | POA: Diagnosis not present

## 2024-04-21 DIAGNOSIS — N1831 Chronic kidney disease, stage 3a: Secondary | ICD-10-CM | POA: Diagnosis not present

## 2024-04-21 DIAGNOSIS — E782 Mixed hyperlipidemia: Secondary | ICD-10-CM | POA: Diagnosis not present

## 2024-04-21 DIAGNOSIS — Z79899 Other long term (current) drug therapy: Secondary | ICD-10-CM | POA: Diagnosis not present

## 2024-04-21 DIAGNOSIS — Z7901 Long term (current) use of anticoagulants: Secondary | ICD-10-CM | POA: Diagnosis not present

## 2024-04-21 DIAGNOSIS — N179 Acute kidney failure, unspecified: Secondary | ICD-10-CM | POA: Diagnosis not present

## 2024-04-21 LAB — ECHOCARDIOGRAM COMPLETE
AR max vel: 2.23 cm2
AV Area VTI: 2.41 cm2
AV Area mean vel: 2.29 cm2
AV Mean grad: 4 mmHg
AV Peak grad: 8.4 mmHg
Ao pk vel: 1.45 m/s
Area-P 1/2: 3.68 cm2
Calc EF: 55.7 %
Height: 68 in
S' Lateral: 2.3 cm
Single Plane A2C EF: 57.7 %
Single Plane A4C EF: 55.6 %
Weight: 3412.8 [oz_av]

## 2024-04-21 LAB — MAGNESIUM: Magnesium: 1.9 mg/dL (ref 1.7–2.4)

## 2024-04-21 LAB — BASIC METABOLIC PANEL WITH GFR
Anion gap: 12 (ref 5–15)
Anion gap: 9 (ref 5–15)
BUN: 32 mg/dL — ABNORMAL HIGH (ref 8–23)
BUN: 24 mg/dL — ABNORMAL HIGH (ref 8–23)
CO2: 31 mmol/L (ref 22–32)
CO2: 32 mmol/L (ref 22–32)
Calcium: 9 mg/dL (ref 8.9–10.3)
Calcium: 8.7 mg/dL — ABNORMAL LOW (ref 8.9–10.3)
Chloride: 102 mmol/L (ref 98–111)
Chloride: 101 mmol/L (ref 98–111)
Creatinine, Ser: 1.29 mg/dL — ABNORMAL HIGH (ref 0.44–1.00)
Creatinine, Ser: 1.33 mg/dL — ABNORMAL HIGH (ref 0.44–1.00)
GFR, Estimated: 41 mL/min — ABNORMAL LOW
GFR, Estimated: 39 mL/min — ABNORMAL LOW
Glucose, Bld: 116 mg/dL — ABNORMAL HIGH (ref 70–99)
Glucose, Bld: 129 mg/dL — ABNORMAL HIGH (ref 70–99)
Potassium: 3 mmol/L — ABNORMAL LOW (ref 3.5–5.1)
Potassium: 3.2 mmol/L — ABNORMAL LOW (ref 3.5–5.1)
Sodium: 145 mmol/L (ref 135–145)
Sodium: 143 mmol/L (ref 135–145)

## 2024-04-21 MED ORDER — POTASSIUM CHLORIDE 2 MEQ/ML IV SOLN
INTRAVENOUS | Status: DC
  Filled 2024-04-21: qty 1000

## 2024-04-21 MED ORDER — TORSEMIDE 20 MG PO TABS: 20.0000 mg | ORAL_TABLET | ORAL | 1 refills | Status: AC

## 2024-04-21 MED ORDER — SACUBITRIL-VALSARTAN 24-26 MG PO TABS: 1.0000 | ORAL_TABLET | Freq: Two times a day (BID) | ORAL | 3 refills | Status: AC

## 2024-04-21 MED ORDER — APIXABAN 5 MG PO TABS: 5.0000 mg | ORAL_TABLET | Freq: Two times a day (BID) | ORAL | Status: DC

## 2024-04-21 NOTE — Consult Note (Addendum)
 "   Cardiology Consultation   Patient ID: Beth Lawson MRN: [id number]; DOB: [birth year]  Admit date: 04/20/2024 Date of Consult: 04/21/2024  PCP:  Beth Prentice SAUNDERS, FNP   [facility] Providers Cardiologist:  None       CC: Acute kidney injury, electrolyte abnormalities, concern for acute congestive heart failure Reason of Consult: Atrial fibrillation Requesting physician: Royal Sill, MD  Patient Profile:   Beth Lawson is a 83 y.o. female with a hx of heart failure with preserved EF, persistent atrial fibrillation (history of cardioversion), type 2 diabetes, hypertension, hyperlipidemia, seizures, hypothyroidism.  who is being seen 04/21/2024 for the evaluation of atrial fibrillation/HFpEF at the request of Royal Sill, MD.  History of Present Illness:   Beth Lawson very pleasant African-American female who presented to the office on April 07, 2024 with volume overload and A-fib.  She was diagnosed with atrial fibrillation back in 2022 and was being followed by the A-fib clinic.  She had a cardioversion in October 2025 in the emergency room department for A-fib with RVR was started on amiodarone  postconversion and due to ER AF amiodarone  loading was recommended.  Patient was not able to tolerate it as she did not feel well.  Amiodarone  was still continued but on a once a day basis at a low dose.  When I saw her on 04/07/2024 patient had significant amount of volume overload consistent with acute congestive heart failure secondary to longstanding A-fib.  Recommended volume management and then consider direct-current cardioversion to restore sinus rhythm if she does not spontaneously convert herself.    I transitioned her from Lasix 40 mg p.o. daily to torsemide  and she has lost 18 pounds since last office visit with diuresis.  However the follow-up labs noted acute kidney injury, electrolyte abnormalities and interestingly not her fluid status was trending  up.  She was asked to come to the ED for further evaluation and management.  Patient has been admitted by internal medicine and renal function has improved her electrolytes have been replaced.  Cardiology consulted for atrial fibrillation management.  Patient seen at bedside accompanied by daughter and granddaughter. She denies anginal chest pain. She denies orthopnea, PND, lower extremity swelling. Both family and patient are very thankful for the amount of diuresis that has taken place secondary to medication changes.   Past Medical History:  Diagnosis Date   Diabetes mellitus, type 2 (HCC)    HTN (hypertension)    Hyperlipidemia    Hypothyroidism    Seizures (HCC)     Past Surgical History:  Procedure Laterality Date   ABDOMINAL HYSTERECTOMY     BREAST SURGERY     THYROIDECTOMY  1988     Home Medications:  Prior to Admission medications  Medication Sig Start Date End Date Taking? Authorizing Provider  amiodarone  (PACERONE ) 200 MG tablet Take 1 tablet (200 mg total) by mouth daily. 03/01/24  Yes Terra Fairy PARAS, PA-C  apixaban  (ELIQUIS ) 5 MG TABS tablet Take 1 tablet (5 mg total) by mouth 2 (two) times daily. 10/15/22 04/21/24 Yes Terra Fairy PARAS, PA-C  atorvastatin  (LIPITOR) 80 MG tablet Take 80 mg by mouth daily.   Yes [provider]  Calcium  Carbonate-Vit D-Min (CALTRATE 600+D PLUS MINERALS) 600-800 MG-UNIT TABS Take 1 tablet by mouth daily.   Yes [provider]  ezetimibe  (ZETIA ) 10 MG tablet Take 10 mg by mouth daily.   Yes [provider]  Glycerin-Polysorbate 80 (REFRESH DRY EYE THERAPY OP) Place 1 drop into both  eyes daily as needed (dry eye).   Yes [provider]  levETIRAcetam  (KEPPRA ) 500 MG tablet Take 500 mg by mouth 2 (two) times daily.   Yes [provider]  levothyroxine  (SYNTHROID ) 150 MCG tablet Take 150 mcg by mouth daily before breakfast. 05/26/20  Yes [provider]  losartan -hydrochlorothiazide  (HYZAAR) 50-12.5 MG tablet Take 1 tablet by mouth daily.   Yes [provider]  metFORMIN  (GLUCOPHAGE ) 500 MG tablet Take 500 mg by mouth 2 (two) times daily with a meal.   Yes [provider]  metoprolol  succinate (TOPROL -XL) 100 MG 24 hr tablet Take 100 mg by mouth daily.   Yes [provider]  Multiple Vitamins-Minerals (CENTRUM SILVER 50+WOMEN) TABS Take 1 tablet by mouth daily.   Yes [provider]  potassium chloride  (KLOR-CON ) 10 MEQ tablet TAKE 2 TABLETS EVERY DAY 05/05/23  Yes Terra Fairy PARAS, PA-C  torsemide  (DEMADEX ) 20 MG tablet Take 1 tablet (20 mg total) by mouth daily. 04/07/24  Yes Nayelly Laughman, DO  amLODipine  (NORVASC ) 10 MG tablet Take 10 mg by mouth daily. Patient not taking: Reported on 04/21/2024 04/07/24   [provider]  furosemide (LASIX) 40 MG tablet Take 40 mg by mouth daily. Patient not taking: Reported on 04/21/2024 04/07/24   [provider]  ketoconazole  (NIZORAL ) 2 % cream Apply to both feet and between toes once daily until scales are gone. Patient not taking: Reported on 03/15/2024 12/04/22   Gaynel Delon CROME, [name]    Inpatient Medications: Scheduled Meds:  amiodarone   200 mg Oral Daily   apixaban   5 mg Oral BID   atorvastatin   80 mg Oral Daily   ezetimibe   10 mg Oral Daily   levETIRAcetam   500 mg Oral BID   levothyroxine   175 mcg Oral Q0600   metoprolol  succinate  100 mg Oral Daily   sodium chloride  flush  3 mL Intravenous Q12H   Continuous Infusions:  lactated ringers 1,000 mL with potassium chloride  40 mEq infusion 75 mL/hr at 04/21/24 0818   PRN Meds: acetaminophen  **OR** acetaminophen , polyethylene glycol  Allergies:   Allergies[1]  Social History:   Social History   Socioeconomic History   Marital status: Widowed    Spouse name: Not on file   Number of children: Not on file   Years of education: Not on file   Highest education level: Not on file  Occupational History   Not on file   Tobacco Use   Smoking status: Never   Smokeless tobacco: Never   Tobacco comments:    Never smoked 02/23/24  Substance and Sexual Activity   Alcohol use: Never   Drug use: Never   Sexual activity: Not on file  Other Topics Concern   Not on file  Social History Narrative   Not on file   Social Drivers of Health   Tobacco Use: Low Risk (04/08/2024)   Patient History    Smoking Tobacco Use: Never    Smokeless Tobacco Use: Never    Passive Exposure: Not on file  Financial Resource Strain: Not on file  Food Insecurity: No Food Insecurity (04/21/2024)   Epic    Worried About Programme Researcher, Broadcasting/film/video in the Last Year: Never true    Ran Out of Food in the Last Year: Never true  Transportation Needs: No Transportation Needs (04/21/2024)   Epic    Lack of Transportation (Medical): No    Lack of Transportation (Non-Medical): No  Physical Activity: Not on file  Stress: Not on file  Social Connections: Patient Declined (04/21/2024)   Social Connection and Isolation Panel    Frequency of Communication with Friends and Family: Patient declined    Frequency of Social Gatherings with Friends and Family: Patient declined    Attends Religious Services: Patient declined    Database Administrator or Organizations: Patient declined    Attends Banker Meetings: Patient declined    Marital Status: Patient declined  Intimate Partner Violence: Not At Risk (04/21/2024)   Epic    Fear of Current or Ex-Partner: No    Emotionally Abused: No    Physically Abused: No    Sexually Abused: No  Depression (PHQ2-9): Not on file  Alcohol Screen: Not on file  Housing: Unknown (04/21/2024)   Epic    Unable to Pay for Housing in the Last Year: No    Number of Times Moved in the Last Year: Not on file    Homeless in the Last Year: Not on file  Utilities: Not At Risk (04/21/2024)   Epic    Threatened with loss of utilities: No  Health Literacy: Not on file    Family History:   Denies  premature coronary artery disease  ROS:  Review of Systems  Cardiovascular:  Negative for chest pain, claudication, irregular heartbeat, leg swelling, near-syncope, orthopnea, palpitations, paroxysmal nocturnal dyspnea and syncope.  Respiratory:  Negative for shortness of breath.   Hematologic/Lymphatic: Negative for bleeding problem.     Physical Exam/Data:   Vitals:   04/20/24 2030 04/21/24 0357 04/21/24 0745 04/21/24 1551  BP: 124/68 (!) 154/90 (!) 150/89 (!) 143/86  Pulse: 67 88 81 75  Resp: 19 19 18 18   Temp: 97.6 F (36.4 C) 97.6 F (36.4 C) 98.2 F (36.8 C) 98.3 F (36.8 C)  TempSrc: Oral Oral Oral   SpO2: 97% 99% 98% 99%  Weight:      Height:        Intake/Output Summary (Last 24 hours) at 04/21/2024 1608 Last data filed at 04/21/2024 0810 Gross per 24 hour  Intake 741.5 ml  Output 1000 ml  Net -258.5 ml   Net IO Since Admission: -258.5 mL [04/21/24 1608]      04/20/2024   12:51 PM 04/07/2024    1:58 PM 03/15/2024    9:04 AM  Last 3 Weights  Weight (lbs) 213 lb 4.8 oz 231 lb 9.6 oz 239 lb  Weight (kg) 96.752 kg 105.053 kg 108.41 kg     Body mass index is 32.43 kg/m.   General: Age-appropriate, hemodynamically stable, not in acute distress HEENT: Normocephalic, atraumatic, trachea midline, no JVP Lungs: Clear to auscultation bilaterally no wheezes rales or rhonchi's. Heart: Irregularly irregular, positive S1-S2, no murmurs rubs or gallops appreciated Abdomen: Protuberant, nondistended, positive bowel sounds in all 4 quadrants Extremities: Warm to touch, no swelling Neuro: Moving all 4 extremities, nonfocal Psych: Normal affect  EKG:  The EKG was personally reviewed and demonstrates: Rate controlled A-fib Telemetry:  Telemetry was personally reviewed and demonstrates: Not on telemetry  Relevant CV Studies: Echocardiogram 04/17/2024: Images reviewed-LVEF is preserved.  Final report pending  Laboratory Data:  High Sensitivity Troponin:  No  results for input(s): TROPONINIHS in the last 720 hours.   Chemistry Recent Labs  Lab 04/19/24 0813 04/20/24 1305 04/20/24 1322 04/21/24 0225  NA 146* 143 142 145  K 3.3* 3.2* 2.9* 3.0*  CL 96 96* 96* 102  CO2 31* 31  --  31  GLUCOSE 99 91 99  116*  BUN 38* 38* 37* 32*  CREATININE 1.93* 1.66* 2.00* 1.29*  CALCIUM  9.7 9.7  --  9.0  MG 1.4* 1.4*  --  1.9  GFRNONAA  --  30*  --  41*  ANIONGAP  --  16*  --  12    Recent Labs  Lab 04/20/24 1305  PROT 6.8  ALBUMIN 4.2  AST 51*  ALT 76*  ALKPHOS 54  BILITOT 0.7   Lipids No results for input(s): CHOL, TRIG, HDL, LABVLDL, LDLCALC, CHOLHDL in the last 168 hours.  Hematology Recent Labs  Lab 04/20/24 1305 04/20/24 1322  WBC 4.2  --   RBC 4.28  --   HGB 12.6 13.3  HCT 39.6 39.0  MCV 92.5  --   MCH 29.4  --   MCHC 31.8  --   RDW 16.6*  --   PLT 253  --    Thyroid  No results for input(s): TSH, FREET4 in the last 168 hours.  BNP Recent Labs  Lab 04/19/24 0813  PROBNP 2,145*    DDimer No results for input(s): DDIMER in the last 168 hours.   Radiology/Studies:  DG Chest 1 View Result Date: 04/20/2024 EXAM: 1 VIEW(S) XRAY OF THE CHEST 04/20/2024 01:19:00 PM COMPARISON: 02/15/2024 CLINICAL HISTORY: Pulmonary edema FINDINGS: LUNGS AND PLEURA: Stable bilateral pulmonary nodules. No pleural effusion. No pneumothorax. HEART AND MEDIASTINUM: Cardiomegaly. Aortic calcification. BONES AND SOFT TISSUES: Thoracic degenerative changes. No acute osseous abnormality. IMPRESSION: 1. No acute cardiopulmonary abnormality. 2. Similar appearance of the vague, 5 mm nodular opacities in both lung bases. Nonemergent chest CT again recommended for further characterization. Electronically signed by: Rogelia Myers MD 04/20/2024 03:22 PM EST RP Workstation: [id number]     Assessment and Plan:  ASSESSMENT:  Persistent atrial fibrillation with controlled ventricular rate. Long-term antiarrhythmic medications. Long-term  anticoagulation. Chronic heart failure with preserved EF. Hypertension. Hyperlipidemia. Non-insulin-dependent diabetes mellitus type 2  PLAN: Since my last office encounter on 04/07/2024 patient has lost approximately 18 pounds secondary to diuresis.  She is euvolemic and at baseline.  Unfortunately, she remains in A-fib with controlled ventricular rate.  Recommended undergoing cardioversion prior to discharge.  However, the day when she came to the hospital she did not take her morning dose of Eliquis .  She will need a TEE guided cardioversion but due to the Christmas holiday this will need to be postponed until 04/23/2024.  Patient wishes to go home for Christmas and will arrange outpatient procedure.  With regards to her atrial fibrillation and HFpEF management: Change torsemide  20 mg p.o. daily to 20 mg every other day Discontinue losartan /hydrochlorothiazide 50/12.5 mg p.o. daily Start Entresto  24/26 mg p.o. twice daily Continue Toprol -XL 100 mg p.o. daily. Continue amiodarone  200 mg p.o. daily. Continue Eliquis  5 mg p.o. twice daily for thromboembolic prophylaxis  Continue atorvastatin  and Zetia  for hyperlipidemia  Informed Consent   Shared Decision Making/Informed Consent   The risks [stroke, cardiac arrhythmias rarely resulting in the need for a temporary or permanent pacemaker, skin irritation or burns, esophageal damage, perforation (1:10,000 risk), bleeding, pharyngeal hematoma as well as other potential complications associated with conscious sedation including aspiration, arrhythmia, respiratory failure and death], benefits (treatment guidance, restoration of normal sinus rhythm, diagnostic support) and alternatives of a transesophageal echocardiogram guided cardioversion were discussed in detail with Beth Lawson and she is willing to proceed.     Risk Assessment/Risk Scores:     New York  Heart Association (NYHA) Functional Class NYHA Class I/II  CHA2DS2-VASc Score = 6  This indicates a 9.7% annual risk of stroke. The patient's score is based upon: CHF History: 1 HTN History: 1 Diabetes History: 1 Stroke History: 0 Vascular Disease History: 0 Age Score: 2 Gender Score: 1    [facility] will sign off.   The patient is ready for discharge today from a cardiac standpoint. Medication Recommendations:   Change torsemide  20 mg p.o. daily to 20 mg every other day Discontinue losartan /hydrochlorothiazide 50/12.5 mg p.o. daily Start Entresto  24/26 mg p.o. twice daily Continue Toprol -XL 100 mg p.o. daily. Continue amiodarone  200 mg p.o. daily. Continue Eliquis  5 mg p.o. twice daily for thromboembolic prophylaxis Other recommendations (labs, testing, etc): Will order outpatient NT-proBNP, CBC, and BMP Follow up as an outpatient: 2 weeks status post cardioversion Patient and daughter aware that the office will reach out to them to schedule the outpatient TEE guided cardioversion.  If the scheduled procedure is after May 10, 2023 she will need to be seen again outpatient to update H&P either by myself or APP. Message has been sent to the office to schedule the procedure. Attending physician updated as well Patient, daughter and granddaughter updated.  For questions or updates, please contact [facility] Please consult [email or web address] for contact info under    Signed, Madonna Large, DO, [facility]  A Division of [facility] [address] [place], [place] [address]  Pager: [phone] Office: [phone] 04/21/2024 4:08 PM      [1] No Known Allergies  "

## 2024-04-21 NOTE — Care Management Obs Status (Signed)
 MEDICARE OBSERVATION STATUS NOTIFICATION   Patient Details  Name: Beth Lawson MRN: [id number] Date of Birth: [birth year]   Medicare Observation Status Notification Given:  Yes    [name] 04/21/2024, 2:27 PM

## 2024-04-21 NOTE — Plan of Care (Signed)

## 2024-04-21 NOTE — Plan of Care (Signed)

## 2024-04-21 NOTE — Progress Notes (Signed)
 DISCHARGE NOTE HOME Beth Lawson to be discharged Home per MD order. Discussed prescriptions and follow up appointments with the patient. Prescriptions given to patient; medication list explained in detail. Patient verbalized understanding.  Skin clean, dry and intact without evidence of skin break down, no evidence of skin tears noted. IV catheter discontinued intact. Site without signs and symptoms of complications. Dressing and pressure applied. Pt denies pain at the site currently. No complaints noted.  Patient free of lines, drains, and wounds.   An After Visit Summary (AVS) was printed and given to the patient. Patient escorted via wheelchair, and discharged home via private auto.  Mairen Wallenstein A Proctor-Gann, RN

## 2024-04-21 NOTE — Progress Notes (Signed)
2D echo complete 

## 2024-04-21 NOTE — Progress Notes (Signed)
 [facility]  Beth Lawson FMW:[id number]  DOB: [birth year]  DOA: 04/20/2024  PCP: Marvene Prentice SAUNDERS, FNP  04/21/2024,7:11 AM  LOS: 0 days    Code Status: Full code     from: Home   83 year old female Known seizure disorder 2019 DMT by 2 hypothyroidism HTN HLD Atrial flutter since June 2022 cardioversion 02/16/2024 on amiodarone  which is hard to tolerate for her NYHA class III HFpEF follows with Dr.Tolia--- last seen by him for a visit 04/07/2024 and torsemide  increased was supposed to be on torsemide  20 losartan  HCTZ as well-- Called back by cardiologist 12/23 given multiple electrolyte modalities routine labs-potassium 3.2 BUN/creatinine 33/1.9 magnesium  1.4 NT BNP 2145 Patient admitted in the setting of the same on 12/23   Assessment  & Plan :    Iatrogenic diuresis related metabolic abnormalities-hypomagnesemia hypokalemia and AKI On PTA combination thiazide ARB AKI resolving hypomagnesemia better-continuing replacement potassium with LR +40 mill equivalents potassium at 75 cc/H Magnesium  was replaced with 2 g we can recheck tomorrow Expect labs will improve in 24 hours then can resume outpatient. stop combo medication at discharge-favor ARB >thiazide with close follow-up  A-flutter CHADVASC >4 on amiodarone  NYHA class III HFpEF last echo EF 65-70% grade 1 DD Continue Toprol  XL 100, amiodarone  200 daily, Eliquis  2.5 twice daily See above discussion regarding HTN med adjustments  DM TY 2/hyperlipidemia Resume Zetia  10 atorvastatin  80 CBGs here not greatly elevated less than 180 so far on chemistries-would not aggressively control given advanced age--- can resume very low-dose metformin  if creatinine continues to improve but may need an alternate medication if tight control is warranted per PCP  Hypothyroidism [also on Synthroid ] Continue levothyroxine  175  Seizure disorder on Keppra  Continue Keppra  500 twice daily  Data Reviewed today: Sodium 145 potassium 3.0 BUN/creatinine 32/1.2  magnesium  1.9   DVT prophylaxis: Apixaban  2.5 twice daily   Dispo/Global plan: Cardiology aware of patient and will see later on today to discuss DCCV versus not with patient/family given holiday schedule/weekend     Subjective:   Awake coherent very pleasant no fever no chills looks comfortable Chest is clear She does not feel swollen at all--overall she says she feels good She seems overall comfortable  Objective + exam Vitals:   04/20/24 1251 04/20/24 1845 04/20/24 2030 04/21/24 0357  BP:  (!) 148/80 124/68 (!) 154/90  Pulse:  78 67 88  Resp:  17 19 19   Temp:  97.9 F (36.6 C) 97.6 F (36.4 C) 97.6 F (36.4 C)  TempSrc:  Oral Oral Oral  SpO2:  99% 97% 99%  Weight: 96.8 kg     Height: 5' 8 (1.727 m)      Filed Weights   04/20/24 1251  Weight: 96.8 kg     Examination: EOMI NCAT?  JVD 30 degrees-S1-S2 no murmur no rub no gallop--was not able to review telemetry-chest is clear no wheeze Left lower extremity slightly more swollen than right with sock edema Abdomen soft no rebound no guarding ROM intact  Scheduled Meds:  amiodarone   200 mg Oral Daily   apixaban   2.5 mg Oral BID   atorvastatin   80 mg Oral Daily   ezetimibe   10 mg Oral Daily   levETIRAcetam   500 mg Oral BID   levothyroxine   175 mcg Oral Q0600   metoprolol  succinate  100 mg Oral Daily   sodium chloride  flush  3 mL Intravenous Q12H   Continuous Infusions:  lactated ringers 1,000 mL with potassium chloride  40 mEq infusion 75 mL/hr at  04/21/24 0818   acetaminophen  **OR** acetaminophen , polyethylene glycol  Time 45  Jai-Gurmukh Christifer Chapdelaine, MD  [facility]

## 2024-04-21 NOTE — Plan of Care (Signed)

## 2024-04-21 NOTE — Discharge Summary (Signed)
 Physician Discharge Summary  Beth Lawson FMW:[id number] DOB: Mar 10, 1941 DOA: 04/20/2024  PCP: Marvene Prentice SAUNDERS, FNP  Admit date: 04/20/2024 Discharge date: 04/21/2024  Time spent: 36 minutes  Recommendations for Outpatient Follow-up:  Requires Chem-12 magnesium  1 week Outpatient get TSH in 2 to 3 weeks Dry weight at discharge 96.8-diuretics to be adjusted in the outpatient setting TOC visit for elective DCCV as per [facility]  Discharge Diagnoses:  MAIN problem for hospitalization   Electrolyte abnormalities in the setting of volume depletion  Please see below for itemized issues addressed in HOpsital- refer to other progress notes for clarity if needed  Discharge Condition: Fair  Diet recommendation: Diabetic heart healthy  Filed Weights   04/20/24 1251  Weight: 96.8 kg    History of present illness:  83 year old female Known seizure disorder 2019 DMT by 2 hypothyroidism HTN HLD Atrial flutter since June 2022 cardioversion 02/16/2024 on amiodarone  which is hard to tolerate for her NYHA class III HFpEF follows with Dr.Tolia--- last seen by him for a visit 04/07/2024 and torsemide  increased was supposed to be on torsemide  20 losartan  HCTZ as well-- Called back by cardiologist 12/23 given multiple electrolyte modalities routine labs-potassium 3.2 BUN/creatinine 33/1.9 magnesium  1.4 NT BNP 2145 Patient admitted in the setting of the same on 12/23    Assessment  & Plan :      Iatrogenic diuresis related metabolic abnormalities-hypomagnesemia hypokalemia and AKI On PTA combination thiazide ARB AKI resolving hypomagnesemia better-continuing replacement potassium with LR +40 mill equivalents potassium at 75 cc/H--- we repeated labs this afternoon and patient really wants to go home for the holiday-magnesium  will need to be checked-medications adjusted as per [facility] --- Stop thiazide ARB combo/amlodipine , change Demadex  to 20 every other day  A-flutter CHADVASC >4 on amiodarone  NYHA  class III HFpEF last echo EF 65-70% grade 1 DD Continue Toprol  XL 100, amiodarone  200 daily, Eliquis  2.5 twice daily Outpatient reeval as per cardiology for DCCV   DM TY 2/hyperlipidemia Resume Zetia  10 atorvastatin  80 CBGs here not greatly elevated less than 180 so far on chemistries-would not aggressively control given advanced age--- can resume very low-dose metformin  and get labs as an outpatient   Hypothyroidism [also on Synthroid ] Continue levothyroxine  175   Seizure disorder on Keppra  Continue Keppra  500 twice daily    Discharge Exam: Vitals:   04/21/24 0745 04/21/24 1551  BP: (!) 150/89 (!) 143/86  Pulse: 81 75  Resp: 18 18  Temp: 98.2 F (36.8 C) 98.3 F (36.8 C)  SpO2: 98% 99%    Subj on day of d/c   See prior note Discharge Instructions   Discharge Instructions     Discharge instructions   Complete by: As directed    Look at your meds carefully several have changed please follow-up with your cardiologist next week he will get you in for labs please notify him if you have chest pain fever bleeding or severe shortness of breath Make sure that you check your sugar regularly Weigh self on a scale daily with the same amount of clothes on-try to restrict salt Noticed the changes to your fluid pill and how you take it Best of luck happy holidays   Increase activity slowly   Complete by: As directed       Allergies as of 04/21/2024   No Known Allergies      Medication List     STOP taking these medications    amLODipine  10 MG tablet Commonly known as: NORVASC    furosemide 40  MG tablet Commonly known as: LASIX   ketoconazole  2 % cream Commonly known as: NIZORAL    losartan -hydrochlorothiazide 50-12.5 MG tablet Commonly known as: HYZAAR       TAKE these medications    amiodarone  200 MG tablet Commonly known as: PACERONE  Take 1 tablet (200 mg total) by mouth daily.   apixaban  5 MG Tabs tablet Commonly known as: ELIQUIS  Take 1 tablet (5  mg total) by mouth 2 (two) times daily.   atorvastatin  80 MG tablet Commonly known as: LIPITOR Take 80 mg by mouth daily.   Caltrate 600+D Plus Minerals 600-800 MG-UNIT Tabs Take 1 tablet by mouth daily.   Centrum Silver 50+Women Tabs Take 1 tablet by mouth daily.   ezetimibe  10 MG tablet Commonly known as: ZETIA  Take 10 mg by mouth daily.   levETIRAcetam  500 MG tablet Commonly known as: KEPPRA  Take 500 mg by mouth 2 (two) times daily.   levothyroxine  150 MCG tablet Commonly known as: SYNTHROID  Take 150 mcg by mouth daily before breakfast.   metFORMIN  500 MG tablet Commonly known as: GLUCOPHAGE  Take 500 mg by mouth 2 (two) times daily with a meal.   metoprolol  succinate 100 MG 24 hr tablet Commonly known as: TOPROL -XL Take 100 mg by mouth daily.   potassium chloride  10 MEQ tablet Commonly known as: KLOR-CON  TAKE 2 TABLETS EVERY DAY   REFRESH DRY EYE THERAPY OP Place 1 drop into both eyes daily as needed (dry eye).   sacubitril -valsartan  24-26 MG Commonly known as: Entresto  Take 1 tablet by mouth 2 (two) times daily.   torsemide  20 MG tablet Commonly known as: DEMADEX  Take 1 tablet (20 mg total) by mouth every other day. What changed: when to take this       Allergies[1]  Follow-up Information     Michele Richardson, DO Follow up on 05/18/2024.   Specialties: Cardiology, Vascular Surgery Why: 9:40AM. Cardiology follow up Contact information: [address] [place] [address] [phone]                  The results of significant diagnostics from this hospitalization (including imaging, microbiology, ancillary and laboratory) are listed below for reference.    Significant Diagnostic Studies: DG Chest 1 View Result Date: 04/20/2024 EXAM: 1 VIEW(S) XRAY OF THE CHEST 04/20/2024 01:19:00 PM COMPARISON: 02/15/2024 CLINICAL HISTORY: Pulmonary edema FINDINGS: LUNGS AND PLEURA: Stable bilateral pulmonary nodules. No pleural effusion. No  pneumothorax. HEART AND MEDIASTINUM: Cardiomegaly. Aortic calcification. BONES AND SOFT TISSUES: Thoracic degenerative changes. No acute osseous abnormality. IMPRESSION: 1. No acute cardiopulmonary abnormality. 2. Similar appearance of the vague, 5 mm nodular opacities in both lung bases. Nonemergent chest CT again recommended for further characterization. Electronically signed by: Rogelia Myers MD 04/20/2024 03:22 PM EST RP Workstation: [id number]    Microbiology: No results found for this or any previous visit (from the past 240 hours).   Labs: Basic Metabolic Panel: Recent Labs  Lab 04/19/24 0813 04/20/24 1305 04/20/24 1322 04/21/24 0225 04/21/24 1611  NA 146* 143 142 145 143  K 3.3* 3.2* 2.9* 3.0* 3.2*  CL 96 96* 96* 102 101  CO2 31* 31  --  31 32  GLUCOSE 99 91 99 116* 129*  BUN 38* 38* 37* 32* 24*  CREATININE 1.93* 1.66* 2.00* 1.29* 1.33*  CALCIUM  9.7 9.7  --  9.0 8.7*  MG 1.4* 1.4*  --  1.9  --    Liver Function Tests: Recent Labs  Lab 04/20/24 1305  AST 51*  ALT 76*  ALKPHOS 54  BILITOT 0.7  PROT 6.8  ALBUMIN 4.2   No results for input(s): LIPASE, AMYLASE in the last 168 hours. No results for input(s): AMMONIA in the last 168 hours. CBC: Recent Labs  Lab 04/20/24 1305 04/20/24 1322  WBC 4.2  --   NEUTROABS 2.2  --   HGB 12.6 13.3  HCT 39.6 39.0  MCV 92.5  --   PLT 253  --    Cardiac Enzymes: No results for input(s): CKTOTAL, CKMB, CKMBINDEX, TROPONINI in the last 168 hours. BNP: BNP (last 3 results) No results for input(s): BNP in the last 8760 hours.  ProBNP (last 3 results) Recent Labs    04/07/24 1521 04/19/24 0813  PROBNP 1,548* 2,145*    CBG: No results for input(s): GLUCAP in the last 168 hours.  Signed:  Colen Grimes MD   [facility] 04/21/2024, 5:13 PM      [1] No Known Allergies

## 2024-04-26 ENCOUNTER — Ambulatory Visit: Payer: Self-pay | Admitting: Cardiology

## 2024-05-05 ENCOUNTER — Other Ambulatory Visit: Payer: Self-pay

## 2024-05-05 DIAGNOSIS — Z79899 Other long term (current) drug therapy: Secondary | ICD-10-CM

## 2024-05-05 DIAGNOSIS — Z01818 Encounter for other preprocedural examination: Secondary | ICD-10-CM

## 2024-05-11 ENCOUNTER — Ambulatory Visit: Admitting: Podiatry

## 2024-05-18 ENCOUNTER — Ambulatory Visit: Attending: Cardiology | Admitting: Cardiology

## 2024-05-18 ENCOUNTER — Encounter: Payer: Self-pay | Admitting: Cardiology

## 2024-05-18 VITALS — BP 130/70 | HR 91 | Resp 16 | Ht 68.0 in | Wt 215.0 lb

## 2024-05-18 DIAGNOSIS — I4819 Other persistent atrial fibrillation: Secondary | ICD-10-CM | POA: Diagnosis not present

## 2024-05-18 DIAGNOSIS — D6869 Other thrombophilia: Secondary | ICD-10-CM | POA: Diagnosis not present

## 2024-05-18 DIAGNOSIS — I5032 Chronic diastolic (congestive) heart failure: Secondary | ICD-10-CM | POA: Diagnosis not present

## 2024-05-18 DIAGNOSIS — I10 Essential (primary) hypertension: Secondary | ICD-10-CM | POA: Diagnosis not present

## 2024-05-18 DIAGNOSIS — E119 Type 2 diabetes mellitus without complications: Secondary | ICD-10-CM | POA: Diagnosis not present

## 2024-05-18 DIAGNOSIS — E782 Mixed hyperlipidemia: Secondary | ICD-10-CM | POA: Diagnosis not present

## 2024-05-18 DIAGNOSIS — Z79899 Other long term (current) drug therapy: Secondary | ICD-10-CM

## 2024-05-18 NOTE — Patient Instructions (Signed)
 Medication Instructions:  Your physician recommends that you continue on your current medications as directed. Please refer to the Current Medication list given to you today.  *If you need a refill on your cardiac medications before your next appointment, please call your pharmacy*  Lab Work: NONE If you have labs (blood work) drawn today and your tests are completely normal, you will receive your results only by: MyChart Message (if you have MyChart) OR A paper copy in the mail If you have any lab test that is abnormal or we need to change your treatment, we will call you to review the results.  Testing/Procedures: NONE  Follow-Up: At [facility], you and your health needs are our priority.  As part of our continuing mission to provide you with exceptional heart care, our providers are all part of one team.  This team includes your primary Cardiologist (physician) and Advanced Practice Providers or APPs (Physician Assistants and Nurse Practitioners) who all work together to provide you with the care you need, when you need it.  Your next appointment:   3 month(s)  Provider:   Madonna Large, DO    We recommend signing up for the patient portal called MyChart.  Sign up information is provided on this After Visit Summary.  MyChart is used to connect with patients for Virtual Visits (Telemedicine).  Patients are able to view lab/test results, encounter notes, upcoming appointments, etc.  Non-urgent messages can be sent to your provider as well.   To learn more about what you can do with MyChart, go to [email or web address].   Other Instructions

## 2024-05-18 NOTE — Progress Notes (Signed)
 "   Cardiology Office Note:    NAME:  Beth Lawson    MRN: [id number] DOB:  Aug 28, 1940   PCP:  Marvene Prentice SAUNDERS, FNP  Former Cardiology Providers: None  Primary Cardiologist:  Madonna Large, DO, [facility] (established care 04/07/2024) Electrophysiologist:  None   Chief Complaint  Patient presents with   Acute heart failure with preserved ejection fraction   Follow-up    Afib.     Patient Profile   Beth Lawson is a 84 y.o. African-American female whose past medical history and cardiovascular risk factors includes: Persistent atrial fibrillation (history of cardioversion), type 2 diabetes, hypertension, hyperlipidemia, seizures, hypothyroidism.   Visits  04/07/2024:  Establish care - for the evaluation of atrial fibrillation at the request of Marvene Prentice SAUNDERS, FNP. Weight was 231 pounds, and acute decompensated heart failure, recommended uptitration of diuretics for volume management prior to considering a repeat cardioversion.  04/20/2024: Follow-up labs concerning for worsening AKI and rising BNP levels.  Referred to ED due to abnormal labs likely secondary to diuresis  04/21/2024: Hospital discharge after correcting electrolyte abnormalities and improvement in renal function.  Patient did not want to stay over the holiday for TEE guided cardioversion.  05/18/2024: Presents for follow-up.  Euvolemic.  Weight 215 pounds.  History of Present Illness    She has persistent atrial fibrillation. She had a prior cardioversion that was initially successful but she reverted back to atrial fibrillation. Since establishing care we have diurized her well and she is 15# less in weight. She currently feels well and better than during her recent hospitalization.  She takes Eliquis  twice daily but missed some doses during a hospitalization in December.  She has no difficulty swallowing pills and has not had esophageal dilation procedures.      Current Medications: Current Meds  Medication Sig    amiodarone  (PACERONE ) 200 MG tablet Take 1 tablet (200 mg total) by mouth daily.   apixaban  (ELIQUIS ) 5 MG TABS tablet Take 1 tablet (5 mg total) by mouth 2 (two) times daily.   atorvastatin  (LIPITOR) 80 MG tablet Take 80 mg by mouth daily.   Calcium  Carbonate-Vit D-Min (CALTRATE 600+D PLUS MINERALS) 600-800 MG-UNIT TABS Take 1 tablet by mouth daily.   ezetimibe  (ZETIA ) 10 MG tablet Take 10 mg by mouth daily.   Glycerin-Polysorbate 80 (REFRESH DRY EYE THERAPY OP) Place 1 drop into both eyes daily as needed (dry eye).   levETIRAcetam  (KEPPRA ) 500 MG tablet Take 500 mg by mouth 2 (two) times daily.   levothyroxine  (SYNTHROID ) 150 MCG tablet Take 150 mcg by mouth daily before breakfast.   metFORMIN  (GLUCOPHAGE ) 500 MG tablet Take 500 mg by mouth 2 (two) times daily with a meal.   metoprolol  succinate (TOPROL -XL) 100 MG 24 hr tablet Take 100 mg by mouth daily.   Multiple Vitamins-Minerals (CENTRUM SILVER 50+WOMEN) TABS Take 1 tablet by mouth daily.   potassium chloride  (KLOR-CON ) 10 MEQ tablet TAKE 2 TABLETS EVERY DAY   sacubitril -valsartan  (ENTRESTO ) 24-26 MG Take 1 tablet by mouth 2 (two) times daily.   torsemide  (DEMADEX ) 20 MG tablet Take 1 tablet (20 mg total) by mouth every other day.     Allergies:    Patient has no known allergies.   Past Medical History: Past Medical History:  Diagnosis Date   Diabetes mellitus, type 2 (HCC)    HTN (hypertension)    Hyperlipidemia    Hypothyroidism    Seizures (HCC)     Past Surgical History: Past Surgical History:  Procedure Laterality Date   ABDOMINAL HYSTERECTOMY     BREAST SURGERY     THYROIDECTOMY  1988    Social History: Social History   Tobacco Use   Smoking status: Never   Smokeless tobacco: Never   Tobacco comments:    Never smoked 02/23/24  Substance Use Topics   Alcohol use: Never   Drug use: Never    Family History: History reviewed. No pertinent family history.  ROS:   Review of Systems  Constitutional:  Positive for weight loss.  Cardiovascular:  Positive for dyspnea on exertion (chronic and stable), leg swelling, orthopnea and paroxysmal nocturnal dyspnea. Negative for chest pain, claudication, irregular heartbeat, near-syncope, palpitations and syncope.  Respiratory:  Positive for shortness of breath (chronic and stable).   Hematologic/Lymphatic: Negative for bleeding problem.    Studies Reviewed:   EKG: EKG Interpretation Date/Time:  Tuesday May 18 2024 10:25:19 EST Ventricular Rate:  84 PR Interval:    QRS Duration:  78 QT Interval:  392 QTC Calculation: 463 R Axis:   64  Text Interpretation: Atrial fibrillation Low voltage QRS Cannot rule out Anterior infarct (cited on or before 07-Apr-2024) When compared with ECG of 20-Apr-2024 13:36, No significant change since last tracing Nonspecific T wave abnormality, improved in Inferior leads Nonspecific T wave abnormality no longer evident in Anterolateral leads QT has lengthened Confirmed by Michele Richardson [phone]) on 05/18/2024 10:28:48 AM  Echocardiogram: 01/2022 LVEF 65 to 70%. No regional motion normalities. Grade 1 diastolic dysfunction. RVSP 36 mmHg, LAE mildly dilated. No significant valvular heart disease  04/21/2024 1. Left ventricular ejection fraction, by estimation, is 60 to 65%. The left ventricle has normal function. The left ventricle has no regional wall motion abnormalities. There is moderate concentric left ventricular hypertrophy. Left ventricular  diastolic parameters are indeterminate.   2. Right ventricular systolic function is normal. The right ventricular size is normal.   3. Left atrial size was moderately dilated.   4. The mitral valve is normal in structure. No evidence of mitral valve regurgitation. No evidence of mitral stenosis.   5. The aortic valve is tricuspid. There is mild calcification of the aortic valve. Aortic valve regurgitation is not visualized. Aortic valve sclerosis/calcification is present,  without any evidence of aortic stenosis.   6. The inferior vena cava is normal in size with greater than 50% respiratory variability, suggesting right atrial pressure of 3 mmHg.   Labs:    Latest Ref Rng & Units 04/20/2024    1:22 PM 04/20/2024    1:05 PM 03/15/2024   10:21 AM  CBC  WBC 4.0 - 10.5 K/uL  4.2  7.2   Hemoglobin 12.0 - 15.0 g/dL 86.6  87.3  87.9   Hematocrit 36.0 - 46.0 % 39.0  39.6  37.1   Platelets 150 - 400 K/uL  253  247        Latest Ref Rng & Units 04/21/2024    4:11 PM 04/21/2024    2:25 AM 04/20/2024    1:22 PM  BMP  Glucose 70 - 99 mg/dL 870  883  99   BUN 8 - 23 mg/dL 24  32  37   Creatinine 0.44 - 1.00 mg/dL 8.66  8.70  7.99   Sodium 135 - 145 mmol/L 143  145  142   Potassium 3.5 - 5.1 mmol/L 3.2  3.0  2.9   Chloride 98 - 111 mmol/L 101  102  96   CO2 22 - 32 mmol/L 32  31  Calcium  8.9 - 10.3 mg/dL 8.7  9.0        Latest Ref Rng & Units 04/21/2024    4:11 PM 04/21/2024    2:25 AM 04/20/2024    1:22 PM  CMP  Glucose 70 - 99 mg/dL 870  883  99   BUN 8 - 23 mg/dL 24  32  37   Creatinine 0.44 - 1.00 mg/dL 8.66  8.70  7.99   Sodium 135 - 145 mmol/L 143  145  142   Potassium 3.5 - 5.1 mmol/L 3.2  3.0  2.9   Chloride 98 - 111 mmol/L 101  102  96   CO2 22 - 32 mmol/L 32  31    Calcium  8.9 - 10.3 mg/dL 8.7  9.0      No results found for: CHOL, HDL, LDLCALC, LDLDIRECT, TRIG, CHOLHDL No results for input(s): LIPOA in the last 8760 hours. No components found for: NTPROBNP Recent Labs    04/07/24 1521 04/19/24 0813  PROBNP 1,548* 2,145*   Recent Labs    02/15/24 1010  TSH 17.315*    Physical Exam:    Today's Vitals   05/18/24 0951  BP: 130/70  Pulse: 91  Resp: 16  SpO2: 95%  Weight: 215 lb (97.5 kg)  Height: 5' 8 (1.727 m)   Body mass index is 32.69 kg/m. Wt Readings from Last 3 Encounters:  05/18/24 215 lb (97.5 kg)  04/20/24 213 lb 4.8 oz (96.8 kg)  04/07/24 231 lb 9.6 oz (105.1 kg)    Physical Exam   Constitutional: No distress.  hemodynamically stable  Neck: No JVD present.  Cardiovascular: Normal rate, regular rhythm, S1 normal and S2 normal. Exam reveals no gallop, no S3 and no S4.  No murmur heard. Pulmonary/Chest: Effort normal and breath sounds normal. No stridor. She has no wheezes. She has no rales.  Musculoskeletal:        General: Edema (+1 bilateral) present.     Cervical back: Neck supple.  Skin: Skin is warm.     Impression & Recommendation(s):  Impression: 1. Chronic heart failure with preserved ejection fraction (HFpEF) (HCC)   2. Persistent atrial fibrillation (HCC)   3. Hypercoagulable state due to persistent atrial fibrillation (HCC)   4. Benign hypertension   5. Mixed hyperlipidemia   6. Non-insulin dependent type 2 diabetes mellitus (HCC)    Recommendation(s):  Persistent atrial fibrillation Dx back in June 2022 per EMR Underwent cardioversion 01/2024 but had ERAF Established w/ afib clinic - started on Amiodarone  and dose was reduced due to side effects of itching.  Has lost 15# with diuretics but remains in Afib.  We discussed both rate vs. rhythm control strategies.   We discussed the risk, benefits, and complication of TEE guided cardioversion - they will discuss and call us  back.  - Consider cardioversion if she decides to proceed. - Ensure adherence to Eliquis  for 30 days prior to cardioversion to avoid TEE. - If Eliquis  doses are missed, perform TEE before cardioversion. - If cardioversion is not pursued, continue current management.  Chronic heart failure with preserved ejection fraction Stage C, NYHA Class II Lost 15# since December 2025 w/ diuresis.  Continue Entresto  24/26mg  po bid.  Continue Torsemide  20mg   po qday  Continue Toprol  XL 100mg  po qday    Hypercoagulable state due to atrial fibrillation Hypercoagulable state secondary to atrial fibrillation, managed with Eliquis . Emphasized adherence to prevent thromboembolic events. -  Continue Eliquis  as prescribed. - Ensure no missed doses  to avoid need for TEE prior to cardioversion. - Denies bleeding    High-risk medication use / AAD Currently on Amiodarone  200mg  po qday  Will need to monitor for Amiodarone  side effect  TSH 17.3 01/2024 AST/ALT 51/76 03/2024 Annual eye exam and PFTs  CXR 03/2024 : No acute cardiopulmonary abnormality.  Similar appearance of the vague, 5 mm nodular opacities in both lung bases. Nonemergent chest CT again recommended for further characterization.    Abnormal Chest Xray:  Has has CXR in October and December in 2025 - nodular opacities needs further evaluation. Recommend that she follows up w/ PCP for further workup. Will send a message to patient as daughter as this was not discussed during OV.    Benign hypertension Office blood pressures are well-controlled. Medications as discussed above   Mixed hyperlipidemia Continue Lipitor 80 mg p.o. nightly and Zetia  10 mg p.o. daily     Orders Placed:  Orders Placed This Encounter  Procedures   EKG 12-Lead     Final Medication List:    No orders of the defined types were placed in this encounter.   There are no discontinued medications.    Current Outpatient Medications:    amiodarone  (PACERONE ) 200 MG tablet, Take 1 tablet (200 mg total) by mouth daily., Disp: , Rfl:    apixaban  (ELIQUIS ) 5 MG TABS tablet, Take 1 tablet (5 mg total) by mouth 2 (two) times daily., Disp: 180 tablet, Rfl: 1   atorvastatin  (LIPITOR) 80 MG tablet, Take 80 mg by mouth daily., Disp: , Rfl:    Calcium  Carbonate-Vit D-Min (CALTRATE 600+D PLUS MINERALS) 600-800 MG-UNIT TABS, Take 1 tablet by mouth daily., Disp: , Rfl:    ezetimibe  (ZETIA ) 10 MG tablet, Take 10 mg by mouth daily., Disp: , Rfl:    Glycerin-Polysorbate 80 (REFRESH DRY EYE THERAPY OP), Place 1 drop into both eyes daily as needed (dry eye)., Disp: , Rfl:    levETIRAcetam  (KEPPRA ) 500 MG tablet, Take 500 mg by mouth 2 (two) times daily.,  Disp: , Rfl:    levothyroxine  (SYNTHROID ) 150 MCG tablet, Take 150 mcg by mouth daily before breakfast., Disp: , Rfl:    metFORMIN  (GLUCOPHAGE ) 500 MG tablet, Take 500 mg by mouth 2 (two) times daily with a meal., Disp: , Rfl:    metoprolol  succinate (TOPROL -XL) 100 MG 24 hr tablet, Take 100 mg by mouth daily., Disp: , Rfl:    Multiple Vitamins-Minerals (CENTRUM SILVER 50+WOMEN) TABS, Take 1 tablet by mouth daily., Disp: , Rfl:    potassium chloride  (KLOR-CON ) 10 MEQ tablet, TAKE 2 TABLETS EVERY DAY, Disp: 180 tablet, Rfl: 3   sacubitril -valsartan  (ENTRESTO ) 24-26 MG, Take 1 tablet by mouth 2 (two) times daily., Disp: 60 tablet, Rfl: 3   torsemide  (DEMADEX ) 20 MG tablet, Take 1 tablet (20 mg total) by mouth every other day., Disp: 90 tablet, Rfl: 1  Consent:   N/A  Disposition:   3 month follow up   Signed, Madonna Michele HAS, [facility]  A Division of [facility] [address] [place], [place] [address]  "

## 2024-05-20 ENCOUNTER — Telehealth: Payer: Self-pay | Admitting: Cardiology

## 2024-05-20 NOTE — Telephone Encounter (Signed)
 Spoke with daughter. States that she would like to take pt to ED for cardioversion. Advised that once they get to ED I was not sure what the providers there would decide but if she felt pt needed to go to ED then she should go. Gave 911/ED precautions. Daughter is not currently with Pt but will take her or call 911 to go get her if needed. Pt was seen in office on Tuesday.

## 2024-05-20 NOTE — Telephone Encounter (Signed)
 Pts mom states that pt had a recent episode and wants to know if she takes her to the ER can she receive the shock treatment that Dr. Michele has been suggesting, please advise. Pts daughter would like a c/b regarding this matter.

## 2024-05-21 NOTE — Telephone Encounter (Signed)
 Spoke with patient's daughter Rosaline, [place] per [place].  She is calling to schedule patient for cardioversion. She states patient was to have one in December but she has been holding off because she is nervous about it. Rosaline reports around 2 AM patient had an episode of her heart racing and breaking out in a sweat, feeling like she was going to black out. The episode passed and patient has felt fine since.  Patient has not missed any doses of Eliquis  per Rosaline.  In office visit note from Dr. Michele on 05/18/24:  She did not want to stay over the holidays for cardioversion and the plan was to consider outpatient TEE guided direct-current cardioversion.    Will forward to Dr. Michele to review and advise on if patient will need TEE/Cardioversion or just a cardioversion.

## 2024-05-21 NOTE — Telephone Encounter (Signed)
 Patient daughter is calling stating they would like to schedule for the patient to get a cardioversion. Please advise.

## 2024-05-23 ENCOUNTER — Encounter: Payer: Self-pay | Admitting: Cardiology

## 2024-05-24 NOTE — Telephone Encounter (Signed)
 Spoke to the patient and daughter over the phone. He would like to proceed forward with cardioversion. She has not skipped any doses of anticoagulation for the last 4 weeks since discharge in the hospital on 04/17/2024. Risks, benefits, and alternatives to cardioversion discussed. She will need CBC and BMP once the cardioversion is scheduled. Will try to arrange it this week referral will May 27, 2024.  @HCINFORMEDCONSENTCOLLAPSED @  Shared Decision Making/Informed Consent The risks (stroke, cardiac arrhythmias rarely resulting in the need for a temporary or permanent pacemaker, skin irritation or burns and complications associated with conscious sedation including aspiration, arrhythmia, respiratory failure and death), benefits (restoration of normal sinus rhythm) and alternatives of a direct current cardioversion were explained in detail to Ms. Kimberlin and she agrees to proceed.   @OBJECTIVEEND @    Dr. Michele

## 2024-05-25 ENCOUNTER — Telehealth: Payer: Self-pay

## 2024-05-25 ENCOUNTER — Ambulatory Visit: Admitting: Podiatry

## 2024-05-25 NOTE — Telephone Encounter (Signed)
 Spoke with patients daughter. Relayed Dr. Tyree note to follow up with PCP in regards to nodular opacities imaged on Chest x-ray. Patients daughter verbalized understanding. Patients daughter then stated that patient is back and forth on Cardioversion procedure. Patients daughter plans to speak with patient today in more depth concerning cardioversion after speaking with Dr. Michele on 05/24/24. Expressed to patients daughter that I would speak with Dr. Michele on issues. All concerns addressed.

## 2024-05-25 NOTE — Telephone Encounter (Signed)
-----   Message from [place], [place] sent at 05/23/2024  1:38 PM EST ----- Harman,   Please call her daughter -- I was dong her notes and looked at her past chest xray from oct and December and she is noted to have nodular opacities in both lung bases. Please have her follow up with PCP she will need a CT scan   Dr. Michele

## 2024-05-30 ENCOUNTER — Other Ambulatory Visit (HOSPITAL_COMMUNITY): Payer: Self-pay | Admitting: Internal Medicine

## 2024-06-17 ENCOUNTER — Ambulatory Visit: Admitting: Podiatry
